# Patient Record
Sex: Female | Born: 1985 | Race: White | Hispanic: No | Marital: Married | Smoking: Former smoker
Health system: Southern US, Community
[De-identification: ages and names within clinical notes are randomized; demographics above are authoritative.]

## PROBLEM LIST (undated history)

## (undated) ENCOUNTER — Inpatient Hospital Stay (HOSPITAL_COMMUNITY): Payer: Self-pay

## (undated) DIAGNOSIS — I499 Cardiac arrhythmia, unspecified: Secondary | ICD-10-CM

## (undated) DIAGNOSIS — O24419 Gestational diabetes mellitus in pregnancy, unspecified control: Secondary | ICD-10-CM

## (undated) DIAGNOSIS — R51 Headache: Secondary | ICD-10-CM

## (undated) DIAGNOSIS — F419 Anxiety disorder, unspecified: Secondary | ICD-10-CM

## (undated) DIAGNOSIS — F329 Major depressive disorder, single episode, unspecified: Secondary | ICD-10-CM

## (undated) DIAGNOSIS — T8859XA Other complications of anesthesia, initial encounter: Secondary | ICD-10-CM

## (undated) DIAGNOSIS — F32A Depression, unspecified: Secondary | ICD-10-CM

## (undated) DIAGNOSIS — E282 Polycystic ovarian syndrome: Secondary | ICD-10-CM

## (undated) HISTORY — DX: Major depressive disorder, single episode, unspecified: F32.9

## (undated) HISTORY — DX: Headache: R51

## (undated) HISTORY — DX: Depression, unspecified: F32.A

## (undated) HISTORY — DX: Polycystic ovarian syndrome: E28.2

## (undated) HISTORY — DX: Anxiety disorder, unspecified: F41.9

## (undated) HISTORY — PX: TONSILLECTOMY: SUR1361

---

## 2012-09-13 ENCOUNTER — Ambulatory Visit (INDEPENDENT_AMBULATORY_CARE_PROVIDER_SITE_OTHER): Payer: 59 | Admitting: Psychiatry

## 2012-09-13 ENCOUNTER — Encounter (HOSPITAL_COMMUNITY): Payer: Self-pay | Admitting: Psychiatry

## 2012-09-13 VITALS — BP 112/81 | HR 88 | Ht 69.0 in | Wt 259.6 lb

## 2012-09-13 DIAGNOSIS — F329 Major depressive disorder, single episode, unspecified: Secondary | ICD-10-CM

## 2012-09-13 DIAGNOSIS — F339 Major depressive disorder, recurrent, unspecified: Secondary | ICD-10-CM

## 2012-09-13 DIAGNOSIS — E669 Obesity, unspecified: Secondary | ICD-10-CM

## 2012-09-13 MED ORDER — BUPROPION HCL ER (XL) 150 MG PO TB24: 150.0000 mg | ORAL_TABLET | ORAL | Status: DC
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
## 2012-09-28 ENCOUNTER — Ambulatory Visit (HOSPITAL_COMMUNITY): Payer: 59 | Admitting: Psychiatry

## 2012-10-04 ENCOUNTER — Ambulatory Visit (INDEPENDENT_AMBULATORY_CARE_PROVIDER_SITE_OTHER): Payer: 59 | Admitting: Psychiatry

## 2012-10-04 NOTE — Progress Notes (Signed)
Patient ID: Brenda Mcdaniel, female   DOB: 11-02-1985, 27 y.o.   MRN: [id number] Presenting Problem Chief Complaint: depression  What are the main stressors in your life right now, how long? 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  Previous mental health services Have you ever been treated for a mental health problem? Yes    Are you currently seeing a therapist or counselor, counselor's name? No   Have you ever had a mental health hospitalization, how many times, length of stay? No   Have you ever been treated with medication, name, reason, response? Yes. Prescribed lexapro and wellbutrin by Dr. Lolly Mustache, discontinued wellbutrin per recommendation of doctor at [facility]. Pt. Reports that she is doing well on the lexapro and xanax.   Have you ever had suicidal thoughts or attempted suicide, when, how? No   Risk factors for Suicide Demographic factors:  Adolescent or young adult and Caucasian Current mental status: none Loss factors: none Historical factors: Family history of mental illness or substance abuse Risk Reduction factors: Living with another person, especially a relative and Positive social support Clinical factors:  Depression and anxiety Cognitive features that contribute to risk: none  SUICIDE RISK:  Minimal: No identifiable suicidal ideation.  Patients presenting with no risk factors but with morbid ruminations; may be classified as minimal risk based on the severity of the depressive symptoms  Medical history   Current medications: lexapro and xanax as needed Prescribed by: [facility] Is there any history of mental health problems or substance abuse in your family, whom? Yes    Social/family history Have you been married, how many times?  Married April 2014  Do you have children?  no  How many pregnancies have you  had?  none  Who lives in your current household? Lives with husband  Military history: No   Religious/spiritual involvement:  What religion/faith base are you? Christian  Family of origin (childhood history)  Where were you born? [place] Where did you grow up? [place]  Describe the atmosphere of the household where you grew up: only child, raised by maternal grandmother. Often felt lonely and depressed. Do you have siblings, step/half siblings? Yes   Are your parents separated/divorced, when and why? Yes   Are your parents alive? Yes. Pt.'s mother remarried and moved away and Pt. Was raised by her maternal grandmother.  Social supports (personal and professional): husband, grandmother, half-brother and half-sister  Education How many grades have you completed? technical college Did you have any problems in school, what type? No  Medications prescribed for these problems? No   Employment (financial issues) Respiratory therapist with [employer]   Legal history none  Trauma/Abuse history: Have you ever been exposed to any form of abuse, what type? No   Have you ever been exposed to something traumatic, describe? No    Substance use None reported  Mental Status: General Appearance Brenda Mcdaniel:  Casual Eye Contact:  Good Motor Behavior:  Normal Speech:  Normal Level of Consciousness:  Alert Mood:  Euthymic Affect:  Appropriate Anxiety Level: minimal Thought Process:  Coherent Thought Content:  WNL Perception:  Normal Judgment:  Good Insight:  Present Cognition: wnl  Diagnosis AXIS I Depressive Disorder NOS  AXIS II No diagnosis  AXIS III Past Medical History  Diagnosis Date  . Headache(784.0)   . Depression     AXIS IV other psychosocial or environmental problems  AXIS V 51-60 moderate symptoms   Plan: Pt. To return in 2 weeks for continued assessment.  _________________________________________        Boneta Lucks, Ph.D., N[facility], [facility]

## 2012-10-19 ENCOUNTER — Other Ambulatory Visit (HOSPITAL_COMMUNITY): Payer: Self-pay | Admitting: *Deleted

## 2012-10-19 DIAGNOSIS — N6320 Unspecified lump in the left breast, unspecified quadrant: Secondary | ICD-10-CM

## 2012-10-25 ENCOUNTER — Ambulatory Visit (INDEPENDENT_AMBULATORY_CARE_PROVIDER_SITE_OTHER): Payer: 59 | Admitting: Psychiatry

## 2012-10-25 ENCOUNTER — Encounter (HOSPITAL_COMMUNITY): Payer: Self-pay | Admitting: Psychiatry

## 2012-10-25 ENCOUNTER — Ambulatory Visit (HOSPITAL_COMMUNITY)
Admission: RE | Admit: 2012-10-25 | Discharge: 2012-10-25 | Disposition: A | Discharge status: Home / Self Care | Payer: 59 | Source: Ambulatory Visit | Attending: Family Medicine | Admitting: Family Medicine

## 2012-10-25 DIAGNOSIS — N63 Unspecified lump in unspecified breast: Secondary | ICD-10-CM | POA: Insufficient documentation

## 2012-10-25 DIAGNOSIS — F4323 Adjustment disorder with mixed anxiety and depressed mood: Secondary | ICD-10-CM

## 2012-10-25 DIAGNOSIS — N6320 Unspecified lump in the left breast, unspecified quadrant: Secondary | ICD-10-CM

## 2012-10-25 NOTE — Progress Notes (Signed)
   THERAPIST PROGRESS NOTE  Session Time: 8:00-8:50  Participation Level: Active  Behavioral Response: CasualAlertEuthymic  Type of Therapy: Individual Therapy  Treatment Goals addressed: coping skills, emotion regulation  Interventions: CBT  Summary: Brenda Mcdaniel is a 27 y.o. female who presents with anxiety.   Suicidal/Homicidal: Nowithout intent/plan  Therapist Response: Processed relationship with mother, husband, and responsibility toward younger siblings. 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 Developed mantra "I am moving through this" to help cope with messages from co-workers and family. Followed up on use of 4.7.8 breathing and introduced heartmath as a stress management tool.  Plan: Return again in 2 weeks.  Diagnosis: Axis I: Anxiety Disorder NOS    Axis II: No diagnosis    Wynonia Musty 10/25/2012

## 2012-11-20 ENCOUNTER — Ambulatory Visit (HOSPITAL_COMMUNITY): Payer: Self-pay | Admitting: Psychiatry

## 2012-12-12 ENCOUNTER — Ambulatory Visit (HOSPITAL_COMMUNITY): Payer: Self-pay | Admitting: Psychiatry

## 2014-08-15 ENCOUNTER — Ambulatory Visit (HOSPITAL_COMMUNITY): Payer: Self-pay | Admitting: Psychiatry

## 2014-08-29 ENCOUNTER — Encounter (INDEPENDENT_AMBULATORY_CARE_PROVIDER_SITE_OTHER): Payer: Self-pay

## 2014-08-29 ENCOUNTER — Ambulatory Visit (INDEPENDENT_AMBULATORY_CARE_PROVIDER_SITE_OTHER): Payer: BLUE CROSS/BLUE SHIELD | Admitting: Psychiatry

## 2014-08-29 ENCOUNTER — Encounter (HOSPITAL_COMMUNITY): Payer: Self-pay | Admitting: Psychiatry

## 2014-08-29 DIAGNOSIS — F331 Major depressive disorder, recurrent, moderate: Secondary | ICD-10-CM

## 2014-08-29 NOTE — Patient Instructions (Signed)
Discussed orally 
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
## 2014-09-12 ENCOUNTER — Encounter (HOSPITAL_COMMUNITY): Payer: Self-pay | Admitting: Psychiatry

## 2014-09-12 ENCOUNTER — Ambulatory Visit (INDEPENDENT_AMBULATORY_CARE_PROVIDER_SITE_OTHER): Payer: BLUE CROSS/BLUE SHIELD | Admitting: Psychiatry

## 2014-09-12 VITALS — BP 115/66 | HR 73 | Ht 69.0 in | Wt 287.0 lb

## 2014-09-12 DIAGNOSIS — F329 Major depressive disorder, single episode, unspecified: Secondary | ICD-10-CM

## 2014-09-12 DIAGNOSIS — E282 Polycystic ovarian syndrome: Secondary | ICD-10-CM | POA: Diagnosis not present

## 2014-09-12 DIAGNOSIS — F32A Depression, unspecified: Secondary | ICD-10-CM

## 2014-09-12 MED ORDER — VILAZODONE HCL 40 MG PO TABS: 40.0000 mg | ORAL_TABLET | Freq: Every day | ORAL | Status: DC

## 2014-09-12 MED ORDER — BUSPIRONE HCL 30 MG PO TABS: 30.0000 mg | ORAL_TABLET | Freq: Two times a day (BID) | ORAL | Status: DC
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
## 2014-10-10 ENCOUNTER — Ambulatory Visit (HOSPITAL_COMMUNITY): Payer: Self-pay | Admitting: Psychiatry

## 2019-09-04 ENCOUNTER — Inpatient Hospital Stay (HOSPITAL_COMMUNITY)
Admission: AD | Admit: 2019-09-04 | Discharge: 2019-09-04 | Disposition: A | Discharge status: Home / Self Care | Payer: BC Managed Care – PPO | Attending: Obstetrics and Gynecology | Admitting: Obstetrics and Gynecology

## 2019-09-04 ENCOUNTER — Encounter (HOSPITAL_COMMUNITY): Payer: Self-pay | Admitting: Obstetrics and Gynecology

## 2019-09-04 ENCOUNTER — Other Ambulatory Visit: Payer: Self-pay

## 2019-09-04 DIAGNOSIS — N949 Unspecified condition associated with female genital organs and menstrual cycle: Secondary | ICD-10-CM | POA: Diagnosis not present

## 2019-09-04 DIAGNOSIS — Z3A13 13 weeks gestation of pregnancy: Secondary | ICD-10-CM | POA: Insufficient documentation

## 2019-09-04 DIAGNOSIS — O26892 Other specified pregnancy related conditions, second trimester: Secondary | ICD-10-CM

## 2019-09-04 DIAGNOSIS — F419 Anxiety disorder, unspecified: Secondary | ICD-10-CM | POA: Insufficient documentation

## 2019-09-04 DIAGNOSIS — Z87891 Personal history of nicotine dependence: Secondary | ICD-10-CM | POA: Diagnosis not present

## 2019-09-04 DIAGNOSIS — R102 Pelvic and perineal pain: Secondary | ICD-10-CM | POA: Diagnosis not present

## 2019-09-04 DIAGNOSIS — O30002 Twin pregnancy, unspecified number of placenta and unspecified number of amniotic sacs, second trimester: Secondary | ICD-10-CM | POA: Diagnosis not present

## 2019-09-04 DIAGNOSIS — Z3A14 14 weeks gestation of pregnancy: Secondary | ICD-10-CM

## 2019-09-04 DIAGNOSIS — Z7984 Long term (current) use of oral hypoglycemic drugs: Secondary | ICD-10-CM | POA: Insufficient documentation

## 2019-09-04 DIAGNOSIS — O26891 Other specified pregnancy related conditions, first trimester: Secondary | ICD-10-CM | POA: Diagnosis not present

## 2019-09-04 DIAGNOSIS — F329 Major depressive disorder, single episode, unspecified: Secondary | ICD-10-CM | POA: Insufficient documentation

## 2019-09-04 DIAGNOSIS — N898 Other specified noninflammatory disorders of vagina: Secondary | ICD-10-CM | POA: Diagnosis not present

## 2019-09-04 DIAGNOSIS — O99341 Other mental disorders complicating pregnancy, first trimester: Secondary | ICD-10-CM | POA: Diagnosis not present

## 2019-09-04 DIAGNOSIS — Z79899 Other long term (current) drug therapy: Secondary | ICD-10-CM | POA: Insufficient documentation

## 2019-09-04 LAB — WET PREP, GENITAL
Sperm: NONE SEEN
Trich, Wet Prep: NONE SEEN
Yeast Wet Prep HPF POC: NONE SEEN

## 2019-09-04 LAB — URINALYSIS, ROUTINE W REFLEX MICROSCOPIC
Bilirubin Urine: NEGATIVE
Glucose, UA: NEGATIVE mg/dL
Hgb urine dipstick: NEGATIVE
Ketones, ur: NEGATIVE mg/dL
Leukocytes,Ua: NEGATIVE
Nitrite: NEGATIVE
Protein, ur: NEGATIVE mg/dL
Specific Gravity, Urine: 1.004 — ABNORMAL LOW (ref 1.005–1.030)
pH: 6 (ref 5.0–8.0)

## 2019-09-04 LAB — AMNISURE RUPTURE OF MEMBRANE (ROM) NOT AT ARMC: Amnisure ROM: NEGATIVE

## 2019-09-04 NOTE — MAU Provider Note (Addendum)
History     CSN: [id number]  Arrival date and time: 09/04/19 1101   First Provider Initiated Contact with Patient 09/04/19 1146      Chief Complaint  Patient presents with  . Pelvic Pain  . Vaginal Discharge  . Rupture of Membranes   Brenda Mcdaniel is a 34 y.o. G1P0 at [redacted]w[redacted]d who presents today with cramping and leaking fluid. She states that she has had cramping pretty consistently throughout the pregnancy, but it is getting worse. She states that she has been leaking fluid in small gushes every couple hours for about 5 days. This is a twin pregnancy. She has had a LEEP done in 2017. Next OB visit 09/14/2019  Pelvic Pain The patient's primary symptoms include pelvic pain and vaginal discharge. This is a new problem. The current episode started in the past 7 days. The problem occurs intermittently. The problem has been gradually worsening. The problem affects both sides. She is pregnant. Associated symptoms include vomiting (x1 about 2 weeks ago). Pertinent negatives include no chills, fever or nausea. The vaginal discharge was watery and clear. There has been no bleeding. Nothing aggravates the symptoms. She has tried acetaminophen for the symptoms. The treatment provided no relief.  Vaginal Discharge The patient's primary symptoms include pelvic pain and vaginal discharge. Associated symptoms include vomiting (x1 about 2 weeks ago). Pertinent negatives include no chills, fever or nausea.    OB History    Gravida  1   Para      Term      Preterm      AB      Living        SAB      TAB      Ectopic      Multiple      Live Births              Past Medical History:  Diagnosis Date  . Anxiety   . Depression   . Headache(784.0)   . Polycystic ovarian disease     Past Surgical History:  Procedure Laterality Date  . TONSILLECTOMY      Family History  Problem Relation Age of Onset  . Depression Mother   . Alcohol abuse Father   . Schizophrenia Father   .  Depression Paternal Aunt   . Depression Paternal Uncle   . Depression Maternal Aunt   . Schizophrenia Maternal Grandfather     Social History   Tobacco Use  . Smoking status: Former Smoker    Types: Cigarettes  . Smokeless tobacco: Never Used  . Tobacco comment: 1 Pack a Week  Substance Use Topics  . Alcohol use: Yes    Alcohol/week: 0.0 standard drinks    Comment: a beer or liquour about 1 x per month  . Drug use: No    Allergies:  Allergies  Allergen Reactions  . Bactrim [Sulfamethoxazole-Trimethoprim] Itching    Yeast infection    Medications Prior to Admission  Medication Sig Dispense Refill Last Dose  . buPROPion (WELLBUTRIN XL) 300 MG 24 hr tablet Take 300 mg by mouth daily.   09/03/2019 at Unknown time  . escitalopram (LEXAPRO) 20 MG tablet Take 20 mg by mouth daily.   09/03/2019 at Unknown time  . famotidine (PEPCID) 20 MG tablet Take 20 mg by mouth 2 (two) times daily.   09/03/2019 at Unknown time  . folic acid (FOLVITE) 1 MG tablet Take 1 mg by mouth daily.   09/03/2019 at Unknown time  .  metFORMIN (GLUCOPHAGE) 500 MG tablet Take 500 mg by mouth 2 (two) times daily with a meal.   09/03/2019 at Unknown time  . Prenatal Vit-Fe Fumarate-FA (PRENATAL MULTIVITAMIN) TABS tablet Take 1 tablet by mouth daily at 12 noon.   09/03/2019 at Unknown time  . ALPRAZolam (XANAX) 0.25 MG tablet Take 0.25 mg by mouth at bedtime as needed for anxiety.     . busPIRone (BUSPAR) 30 MG tablet Take 1 tablet (30 mg total) by mouth 2 (two) times daily. 60 tablet 2   . ibuprofen (ADVIL,MOTRIN) 800 MG tablet Take 800 mg by mouth 3 (three) times daily as needed.     Marland Kitchen NAPROXEN PO Take by mouth 2 (two) times daily as needed.     . Vilazodone HCl (VIIBRYD) 40 MG TABS Take 1 tablet (40 mg total) by mouth daily. 30 tablet 2     Review of Systems  Constitutional: Negative for chills and fever.  Gastrointestinal: Positive for vomiting (x1 about 2 weeks ago). Negative for nausea.  Genitourinary: Positive  for pelvic pain and vaginal discharge.   Physical Exam   Blood pressure 127/76, pulse (!) 101, temperature 98.1 F (36.7 C), temperature source Oral, resp. rate 18, height 5\' 9"  (1.753 m), weight 121.6 kg, SpO2 100 %.  Physical Exam  Nursing note and vitals reviewed. Constitutional: She is oriented to person, place, and time. She appears well-developed and well-nourished. No distress.  HENT:  Head: Normocephalic.  Cardiovascular: Normal rate.  Respiratory: Effort normal.  GI: Soft. There is no abdominal tenderness. There is no rebound.  Genitourinary:    Genitourinary Comments:  External: no lesion Vagina: small amount of milky white discharge. No pooling  Cervix: pink, smooth, no CMT, closed/thick. No fluid seen with valsalva  Uterus: AGA    Neurological: She is alert and oriented to person, place, and time.  Skin: Skin is warm and dry.  Psychiatric: She has a normal mood and affect.     Pt informed that the ultrasound is considered a limited OB ultrasound and is not intended to be a complete ultrasound exam.  Patient also informed that the ultrasound is not being completed with the intent of assessing for fetal or placental anomalies or any pelvic abnormalities.  Explained that the purpose of today's ultrasound is to assess for  viability.  Patient acknowledges the purpose of the exam and the limitations of the study.   Baby A 150 Baby B 160 Subjectively normal amniotic fluid.    Results for orders placed or performed during the hospital encounter of 09/04/19 (from the past 24 hour(s))  Urinalysis, Routine w reflex microscopic     Status: Abnormal   Collection Time: 09/04/19 12:15 PM  Result Value Ref Range   Color, Urine STRAW (A) YELLOW   APPearance CLEAR CLEAR   Specific Gravity, Urine 1.004 (L) 1.005 - 1.030   pH 6.0 5.0 - 8.0   Glucose, UA NEGATIVE NEGATIVE mg/dL   Hgb urine dipstick NEGATIVE NEGATIVE   Bilirubin Urine NEGATIVE NEGATIVE   Ketones, ur NEGATIVE  NEGATIVE mg/dL   Protein, ur NEGATIVE NEGATIVE mg/dL   Nitrite NEGATIVE NEGATIVE   Leukocytes,Ua NEGATIVE NEGATIVE  Amnisure rupture of membrane (rom)not at [facility]     Status: None   Collection Time: 09/04/19 12:26 PM  Result Value Ref Range   Amnisure ROM NEGATIVE   Wet prep, genital     Status: Abnormal   Collection Time: 09/04/19 12:26 PM  Result Value Ref Range   Yeast Wet  Prep HPF POC NONE SEEN NONE SEEN   Trich, Wet Prep NONE SEEN NONE SEEN   Clue Cells Wet Prep HPF POC PRESENT (A) NONE SEEN   WBC, Wet Prep HPF POC MANY (A) NONE SEEN   Sperm NONE SEEN      MAU Course  Procedures  MDM  Assessment and Plan    1. Round ligament pain   2. Vaginal discharge during pregnancy in second trimester   3. [redacted] weeks gestation of pregnancy   4. Twin gestation in second trimester, unspecified multiple gestation type    DC home Comfort measures reviewed  2nd Trimester precautions  Bleeding precautions RX: none  Return to MAU as needed FU with OB as planned  Follow-up Information    Ob/Gyn, [facility] Follow up.   Contact information: [address] [place] [address] [place] DNP, CNM  09/04/19  1:45 PM

## 2019-09-04 NOTE — Discharge Instructions (Signed)

## 2019-09-04 NOTE — MAU Note (Signed)
Has been cramping pretty bad.  preg with twins. Will be 14wks this week. Cramps are more constant, though not more painful.  Has been having some leakage, clear.  Thought it was urine, had bought some liners, been going on about a wk. Had called office, couldn't get in- was told to come here.

## 2019-09-05 LAB — GC/CHLAMYDIA PROBE AMP (~~LOC~~) NOT AT ARMC
Chlamydia: NEGATIVE
Comment: NEGATIVE
Comment: NORMAL
Neisseria Gonorrhea: NEGATIVE

## 2019-10-03 ENCOUNTER — Encounter: Payer: BC Managed Care – PPO | Attending: Obstetrics and Gynecology | Admitting: Registered"

## 2019-10-03 ENCOUNTER — Other Ambulatory Visit: Payer: Self-pay

## 2019-10-03 ENCOUNTER — Encounter: Payer: Self-pay | Admitting: Registered"

## 2019-10-03 DIAGNOSIS — O99810 Abnormal glucose complicating pregnancy: Secondary | ICD-10-CM | POA: Diagnosis present

## 2019-10-03 NOTE — Progress Notes (Signed)
Patient was seen on 10/03/19 for Gestational Diabetes self-management class at the [facility]. The following learning objectives were met by the patient during this course:   States the definition of Gestational Diabetes  States why dietary management is important in controlling blood glucose  Describes the effects each nutrient has on blood glucose levels  Demonstrates ability to create a balanced meal plan  Demonstrates carbohydrate counting   States when to check blood glucose levels  Demonstrates proper blood glucose monitoring techniques  States the effect of stress and exercise on blood glucose levels  States the importance of limiting caffeine and abstaining from alcohol and smoking  Blood glucose monitor given: [employer] # [id number] Exp: 11/17/19 Blood glucose reading: 171 mg/dL  Patient instructed to monitor glucose levels: FBS: 60 - <95; 1 hour: <140; 2 hour: <120  Patient received handouts:  Nutrition Diabetes and Pregnancy, including carb counting list  Patient will be seen for follow-up as needed.

## 2020-01-11 ENCOUNTER — Telehealth: Payer: Self-pay

## 2020-01-11 NOTE — Telephone Encounter (Signed)
NOTES ON FILE FROM MICHELLE MARINONE [phone], SENT REFERRAL TO SCHEDULING

## 2020-01-16 ENCOUNTER — Other Ambulatory Visit: Payer: Self-pay | Admitting: Obstetrics and Gynecology

## 2020-02-05 ENCOUNTER — Telehealth (HOSPITAL_COMMUNITY): Payer: Self-pay | Admitting: *Deleted

## 2020-02-05 NOTE — Telephone Encounter (Signed)
Preadmission screen  

## 2020-02-05 NOTE — Patient Instructions (Signed)
Brenda Mcdaniel  02/05/2020   Your procedure is scheduled on:  02/19/2020  Arrive at 1030 at Entrance C on [address] at [facility]  and [address]. You are invited to use the FREE valet parking or use the Visitor's parking deck.  Pick up the phone at the desk and dial [phone].  Call this number if you have problems the morning of surgery: [phone]  Remember:   Do not eat food:(After Midnight) Desps de medianoche.  Do not drink clear liquids: (After Midnight) Desps de medianoche.  Take these medicines the morning of surgery with A SIP OF WATER:  Take lexapro and wellbutrin as prescribed.  Do not take metformin the night before or day of surgery.   Do not wear jewelry, make-up or nail polish.  Do not wear lotions, powders, or perfumes. Do not wear deodorant.  Do not shave 48 hours prior to surgery.  Do not bring valuables to the hospital.  [facility] is not   responsible for any belongings or valuables brought to the hospital.  Contacts, dentures or bridgework may not be worn into surgery.  Leave suitcase in the car. After surgery it may be brought to your room.  For patients admitted to the hospital, checkout time is 11:00 AM the day of              discharge.      Please read over the following fact sheets that you were given:     Preparing for Surgery

## 2020-02-07 ENCOUNTER — Telehealth (HOSPITAL_COMMUNITY): Payer: Self-pay | Admitting: *Deleted

## 2020-02-07 NOTE — Telephone Encounter (Signed)
Preadmission screen  

## 2020-02-10 NOTE — Progress Notes (Signed)
Cardiology Office Note:   Date:  02/11/2020  NAME:  Brenda Mcdaniel    MRN: [id number] DOB:  12/01/1985   PCP:  Selinda Flavin, MD  Cardiologist:  Reatha Harps, MD   Referring MD: Charlett Nose, MD   Chief Complaint  Patient presents with  . Tachycardia   History of Present Illness:   Brenda Mcdaniel is a 34 y.o. female with a hx of obesity who is being seen today for the evaluation of palpitations at the request of Charlett Nose, MD.  She reports since pregnant she has had episodes where her heart rate increases quite rapidly.  Associated symptoms include shortness of breath.  She reports certain activities such as laying on her back as well as certain movements can cause her heart rate to go up into the 170s.  She reports has not had any syncope but does get a little dizzy.  She reports her heart rate goes down with position change and runs around 90 to 110 bpm.  She does not get any chest pain.  This is her first pregnancy.  She does have gestational diabetes.  She does have a history of depression but there is no history of hypertension or cardiovascular disease.  She is morbidly obese with a BMI 42.  No concerns for sleep apnea.  She is not anemic per review of recent blood work.  Her hemoglobin was 11.5 in August 2021.  She is pregnant with twins.  She will have a delivery on February 19, 2020.  Her EKG today demonstrated sinus tachycardia initially with a heart rate of 165.  There is a clear P wave.  This was obtained on her back.  We did obtain several EKGs in the office.  When she laid on her right side her heart rate would come down gradually and still demonstrated sinus tachycardia.  There was no abrupt change in her heart rate to suggest an arrhythmia.  Her heart rate would come down to around 118 which is where she reports it is.  She was noticeably less short of breath when her heart rate was not 160.  I then asked her to lay back on her back.  Her heart rate increased  again.  Associated symptoms include shortness of breath.  This was all sinus tachycardia.  She is a former smoker but not currently smoking.  She is not drinking alcohol.  No illicit drugs.  There is no strong family history of heart disease.  No evidence of heart failure on examination.  Past Medical History: Past Medical History:  Diagnosis Date  . Anxiety   . Depression   . Headache(784.0)   . Polycystic ovarian disease     Past Surgical History: Past Surgical History:  Procedure Laterality Date  . TONSILLECTOMY      Current Medications: Current Meds  Medication Sig  . buPROPion (WELLBUTRIN XL) 300 MG 24 hr tablet Take 300 mg by mouth daily.  Marland Kitchen escitalopram (LEXAPRO) 20 MG tablet Take 20 mg by mouth daily.  Marland Kitchen esomeprazole (NEXIUM) 10 MG packet Nexium  . folic acid (FOLVITE) 1 MG tablet Take 1 mg by mouth daily.  . metFORMIN (GLUCOPHAGE) 500 MG tablet Take 500 mg by mouth 2 (two) times daily with a meal.  . Prenatal Vit-Fe Fumarate-FA (PRENATAL MULTIVITAMIN) TABS tablet Take 1 tablet by mouth daily at 12 noon.  . [DISCONTINUED] famotidine (PEPCID) 20 MG tablet Take 20 mg by mouth 2 (two) times daily.  Allergies:    Bactrim [sulfamethoxazole-trimethoprim]   Social History: Social History   Socioeconomic History  . Marital status: Married    Spouse name: Not on file  . Number of children: Not on file  . Years of education: Not on file  . Highest education level: Not on file  Occupational History  . Occupation: therapist  Tobacco Use  . Smoking status: Former Smoker    Years: 7.00    Types: Cigarettes  . Smokeless tobacco: Never Used  . Tobacco comment: 1 Pack a Week  Substance and Sexual Activity  . Alcohol use: Yes    Alcohol/week: 0.0 standard drinks    Comment: a beer or liquour about 1 x per month  . Drug use: No  . Sexual activity: Yes    Birth control/protection: None  Other Topics Concern  . Not on file  Social History Narrative  . Not on file    Social Determinants of Health   Financial Resource Strain:   . Difficulty of Paying Living Expenses: Not on file  Food Insecurity:   . Worried About Programme researcher, broadcasting/film/video in the Last Year: Not on file  . Ran Out of Food in the Last Year: Not on file  Transportation Needs:   . Lack of Transportation (Medical): Not on file  . Lack of Transportation (Non-Medical): Not on file  Physical Activity:   . Days of Exercise per Week: Not on file  . Minutes of Exercise per Session: Not on file  Stress:   . Feeling of Stress : Not on file  Social Connections:   . Frequency of Communication with Friends and Family: Not on file  . Frequency of Social Gatherings with Friends and Family: Not on file  . Attends Religious Services: Not on file  . Active Member of Clubs or Organizations: Not on file  . Attends Banker Meetings: Not on file  . Marital Status: Not on file     Family History: The patient's family history includes Alcohol abuse in her father; Depression in her maternal aunt, mother, paternal aunt, and paternal uncle; Schizophrenia in her father and maternal grandfather.  ROS:   All other ROS reviewed and negative. Pertinent positives noted in the HPI.     EKGs/Labs/Other Studies Reviewed:   The following studies were personally reviewed by me today:  EKG:  EKG is ordered today.  The ekg ordered today demonstrates sinus tachycardia, heart rate 165, subsequent EKG demonstrates sinus tachycardia, heart rate 132, subsequent EKG demonstrated sinus tachycardia with heart rate 119 all appeared to be sinus tachycardia and were personally reviewed by me.  Recent Labs: No results found for requested labs within last 8760 hours.   Recent Lipid Panel No results found for: CHOL, TRIG, HDL, CHOLHDL, VLDL, LDLCALC, LDLDIRECT  Physical Exam:   VS:  BP 115/70   Pulse (!) 165   Temp (!) 97.3 F (36.3 C)   Ht 5\' 7"  (1.702 m)   Wt 270 lb (122.5 kg)   SpO2 98%   BMI 42.29 kg/m     Wt Readings from Last 3 Encounters:  02/11/20 270 lb (122.5 kg)  09/04/19 268 lb (121.6 kg)    General: Well nourished, well developed, in no acute distress Heart: Atraumatic, normal size  Eyes: PEERLA, EOMI  Neck: Supple, no JVD Endocrine: No thryomegaly Cardiac: Normal S1, S2; RRR; no murmurs, rubs, or gallops Lungs: Clear to auscultation bilaterally, no wheezing, rhonchi or rales  Abd: Soft, nontender, no hepatomegaly  Ext: No edema, pulses 2+ Musculoskeletal: No deformities, BUE and BLE strength normal and equal Skin: Warm and dry, no rashes   Neuro: Alert and oriented to person, place, time, and situation, CNII-XII grossly intact, no focal deficits  Psych: Normal mood and affect   ASSESSMENT:   TAKEIA CIARAVINO is a 34 y.o. female who presents for the following: 1. Sinus tachycardia     PLAN:   1. Sinus tachycardia -Initial heart rate when laying on her back was 165.  EKG demonstrates sinus tachycardia.  When she lays on her side her heart rate goes down slowly.  We did observe her in the office with change in position.  Her heart rate would slowly come down.  This is suggestive of sinus tachycardia.  This is not an arrhythmia.  When I asked her to lay back on her back her heart rate would then gradually go back up into the 160 to 170 bpm range. -Overall I suspect she is having some sort of compressive issue from her pregnancy.  Clearly position change does cause her sinus node heart increase.  Again this is normal.  We do need to check a thyroid study to make sure this is not an issue.  She also was borderline anemic but is followed closely by obstetrics. -She has no murmur or evidence of heart failure on examination.  I do not see a need for an echocardiogram at this time. -I would recommend she avoid certain positions in which her heart rate increases and stay well-hydrated.  The ultimate treatment will be delivery.  I suspect most of her issues with her rapid heartbeat sensation  and sinus tachycardia will resolve with delivery.  I will see her back in 3 months after delivery to reassess symptoms.  I suspect all this will get better after delivery.  Disposition: Return in about 3 months (around 05/13/2020).  Medication Adjustments/Labs and Tests Ordered: Current medicines are reviewed at length with the patient today.  Concerns regarding medicines are outlined above.  Orders Placed This Encounter  Procedures  . TSH  . EKG 12-Lead   No orders of the defined types were placed in this encounter.   Patient Instructions  Medication Instructions:  No changes *If you need a refill on your cardiac medications before your next appointment, please call your pharmacy*   Lab Work: TSH If you have labs (blood work) drawn today and your tests are completely normal, you will receive your results only by: Marland Kitchen MyChart Message (if you have MyChart) OR . A paper copy in the mail If you have any lab test that is abnormal or we need to change your treatment, we will call you to review the results.   Testing/Procedures: Not needed   Follow-Up: At [facility], you and your health needs are our priority.  As part of our continuing mission to provide you with exceptional heart care, we have created designated Provider Care Teams.  These Care Teams include your primary Cardiologist (physician) and Advanced Practice Providers (APPs -  Physician Assistants and Nurse Practitioners) who all work together to provide you with the care you need, when you need it.  We recommend signing up for the patient portal called "MyChart".  Sign up information is provided on this After Visit Summary.  MyChart is used to connect with patients for Virtual Visits (Telemedicine).  Patients are able to view lab/test results, encounter notes, upcoming appointments, etc.  Non-urgent messages can be sent to your provider as well.  To learn more about what you can do with MyChart, go to  [email or web address].    Your next appointment:   3 month(s)  The format for your next appointment:   In Person  Provider:   Lennie OdorWesley O'Neal, MD     Signed, Lenna GilfordWesley T. Flora Lipps'Neal, MD [facility]  [facility]  [address], [address] [place], [place] [address] [phone]  02/11/2020 11:55 AM

## 2020-02-11 ENCOUNTER — Other Ambulatory Visit: Payer: Self-pay

## 2020-02-11 ENCOUNTER — Encounter: Payer: Self-pay | Admitting: Cardiovascular Disease

## 2020-02-11 ENCOUNTER — Ambulatory Visit (INDEPENDENT_AMBULATORY_CARE_PROVIDER_SITE_OTHER): Payer: BC Managed Care – PPO | Admitting: Cardiovascular Disease

## 2020-02-11 VITALS — BP 115/70 | HR 165 | Temp 97.3°F | Ht 67.0 in | Wt 270.0 lb

## 2020-02-11 DIAGNOSIS — R000 Tachycardia, unspecified: Secondary | ICD-10-CM

## 2020-02-11 NOTE — Patient Instructions (Signed)
Medication Instructions:  No changes *If you need a refill on your cardiac medications before your next appointment, please call your pharmacy*   Lab Work: TSH If you have labs (blood work) drawn today and your tests are completely normal, you will receive your results only by: Marland Kitchen MyChart Message (if you have MyChart) OR . A paper copy in the mail If you have any lab test that is abnormal or we need to change your treatment, we will call you to review the results.   Testing/Procedures: Not needed   Follow-Up: At [facility], you and your health needs are our priority.  As part of our continuing mission to provide you with exceptional heart care, we have created designated Provider Care Teams.  These Care Teams include your primary Cardiologist (physician) and Advanced Practice Providers (APPs -  Physician Assistants and Nurse Practitioners) who all work together to provide you with the care you need, when you need it.  We recommend signing up for the patient portal called "MyChart".  Sign up information is provided on this After Visit Summary.  MyChart is used to connect with patients for Virtual Visits (Telemedicine).  Patients are able to view lab/test results, encounter notes, upcoming appointments, etc.  Non-urgent messages can be sent to your provider as well.   To learn more about what you can do with MyChart, go to [email or web address].    Your next appointment:   3 month(s)  The format for your next appointment:   In Person  Provider:   Lennie Odor, MD

## 2020-02-12 ENCOUNTER — Telehealth (HOSPITAL_COMMUNITY): Payer: Self-pay | Admitting: *Deleted

## 2020-02-12 ENCOUNTER — Encounter (HOSPITAL_COMMUNITY): Payer: Self-pay

## 2020-02-12 LAB — TSH: TSH: 2.36 u[IU]/mL (ref 0.450–4.500)

## 2020-02-12 NOTE — Telephone Encounter (Signed)
Preadmission screen  

## 2020-02-14 ENCOUNTER — Encounter: Payer: Self-pay | Admitting: *Deleted

## 2020-02-18 ENCOUNTER — Other Ambulatory Visit (HOSPITAL_COMMUNITY)
Admission: RE | Admit: 2020-02-18 | Discharge: 2020-02-18 | Disposition: A | Discharge status: Home / Self Care | Payer: BC Managed Care – PPO | Source: Ambulatory Visit | Attending: Obstetrics and Gynecology | Admitting: Obstetrics and Gynecology

## 2020-02-18 ENCOUNTER — Telehealth (HOSPITAL_COMMUNITY): Payer: Self-pay | Admitting: *Deleted

## 2020-02-18 ENCOUNTER — Other Ambulatory Visit: Payer: Self-pay

## 2020-02-18 ENCOUNTER — Other Ambulatory Visit (HOSPITAL_COMMUNITY)
Admission: RE | Admit: 2020-02-18 | Discharge: 2020-02-18 | Disposition: A | Discharge status: Home / Self Care | Payer: BC Managed Care – PPO | Source: Ambulatory Visit | Attending: Obstetrics & Gynecology | Admitting: Obstetrics & Gynecology

## 2020-02-18 ENCOUNTER — Encounter (HOSPITAL_COMMUNITY): Payer: Self-pay

## 2020-02-18 DIAGNOSIS — Z20822 Contact with and (suspected) exposure to covid-19: Secondary | ICD-10-CM | POA: Insufficient documentation

## 2020-02-18 DIAGNOSIS — Z01818 Encounter for other preprocedural examination: Secondary | ICD-10-CM | POA: Insufficient documentation

## 2020-02-18 HISTORY — DX: Other complications of anesthesia, initial encounter: T88.59XA

## 2020-02-18 HISTORY — DX: Cardiac arrhythmia, unspecified: I49.9

## 2020-02-18 LAB — TYPE AND SCREEN
ABO/RH(D): A POS
Antibody Screen: NEGATIVE

## 2020-02-18 LAB — RPR: RPR Ser Ql: NONREACTIVE

## 2020-02-18 LAB — CBC
HCT: 33.3 % — ABNORMAL LOW (ref 36.0–46.0)
Hemoglobin: 10 g/dL — ABNORMAL LOW (ref 12.0–15.0)
MCH: 21.3 pg — ABNORMAL LOW (ref 26.0–34.0)
MCHC: 30 g/dL (ref 30.0–36.0)
MCV: 71 fL — ABNORMAL LOW (ref 80.0–100.0)
Platelets: 307 10*3/uL (ref 150–400)
RBC: 4.69 MIL/uL (ref 3.87–5.11)
RDW: 17.3 % — ABNORMAL HIGH (ref 11.5–15.5)
WBC: 7.5 10*3/uL (ref 4.0–10.5)
nRBC: 0 % (ref 0.0–0.2)

## 2020-02-18 LAB — SARS CORONAVIRUS 2 (TAT 6-24 HRS): SARS Coronavirus 2: NEGATIVE

## 2020-02-18 NOTE — Telephone Encounter (Signed)
Cs arrival time changed to 0530.  CS rescheduled to 0730 at Dr Henderson Cloud request.  Pt notified and verbalized understanding of change in arrival time.

## 2020-02-19 ENCOUNTER — Encounter (HOSPITAL_COMMUNITY)
Admission: RE | Disposition: A | Discharge status: Home / Self Care | Payer: Self-pay | Source: Home / Self Care | Attending: Obstetrics and Gynecology

## 2020-02-19 ENCOUNTER — Inpatient Hospital Stay (HOSPITAL_COMMUNITY)
Admission: RE | Admit: 2020-02-19 | Discharge: 2020-02-21 | DRG: 786 | Disposition: A | Discharge status: Home / Self Care | Payer: BC Managed Care – PPO | Attending: Obstetrics and Gynecology | Admitting: Obstetrics and Gynecology

## 2020-02-19 ENCOUNTER — Encounter (HOSPITAL_COMMUNITY): Payer: Self-pay | Admitting: Obstetrics and Gynecology

## 2020-02-19 ENCOUNTER — Inpatient Hospital Stay (HOSPITAL_COMMUNITY): Payer: BC Managed Care – PPO | Admitting: Certified Registered Nurse Anesthetist

## 2020-02-19 ENCOUNTER — Other Ambulatory Visit: Payer: Self-pay

## 2020-02-19 DIAGNOSIS — O2412 Pre-existing diabetes mellitus, type 2, in childbirth: Secondary | ICD-10-CM | POA: Diagnosis present

## 2020-02-19 DIAGNOSIS — O328XX2 Maternal care for other malpresentation of fetus, fetus 2: Secondary | ICD-10-CM | POA: Diagnosis present

## 2020-02-19 DIAGNOSIS — O30043 Twin pregnancy, dichorionic/diamniotic, third trimester: Secondary | ICD-10-CM | POA: Diagnosis present

## 2020-02-19 DIAGNOSIS — O9081 Anemia of the puerperium: Secondary | ICD-10-CM | POA: Diagnosis not present

## 2020-02-19 DIAGNOSIS — E119 Type 2 diabetes mellitus without complications: Secondary | ICD-10-CM | POA: Diagnosis present

## 2020-02-19 DIAGNOSIS — Z7984 Long term (current) use of oral hypoglycemic drugs: Secondary | ICD-10-CM

## 2020-02-19 DIAGNOSIS — Z20822 Contact with and (suspected) exposure to covid-19: Secondary | ICD-10-CM | POA: Diagnosis present

## 2020-02-19 DIAGNOSIS — Z3A37 37 weeks gestation of pregnancy: Secondary | ICD-10-CM

## 2020-02-19 DIAGNOSIS — Z87891 Personal history of nicotine dependence: Secondary | ICD-10-CM

## 2020-02-19 DIAGNOSIS — D62 Acute posthemorrhagic anemia: Secondary | ICD-10-CM | POA: Diagnosis not present

## 2020-02-19 DIAGNOSIS — O329XX1 Maternal care for malpresentation of fetus, unspecified, fetus 1: Secondary | ICD-10-CM | POA: Diagnosis present

## 2020-02-19 LAB — GLUCOSE, CAPILLARY
Glucose-Capillary: 69 mg/dL — ABNORMAL LOW (ref 70–99)
Glucose-Capillary: 86 mg/dL (ref 70–99)
Glucose-Capillary: 80 mg/dL (ref 70–99)
Glucose-Capillary: 117 mg/dL — ABNORMAL HIGH (ref 70–99)
Glucose-Capillary: 68 mg/dL — ABNORMAL LOW (ref 70–99)
Glucose-Capillary: 77 mg/dL (ref 70–99)
Glucose-Capillary: 50 mg/dL — ABNORMAL LOW (ref 70–99)
Glucose-Capillary: 76 mg/dL (ref 70–99)

## 2020-02-19 SURGERY — Surgical Case
Anesthesia: Spinal | Wound class: Clean Contaminated

## 2020-02-19 MED ORDER — DEXTROSE 50 % IV SOLN
12.5000 g | INTRAVENOUS | Status: AC
  Administered 2020-02-19: 12.5 g via INTRAVENOUS

## 2020-02-19 MED ORDER — PHENYLEPHRINE 40 MCG/ML (10ML) SYRINGE FOR IV PUSH (FOR BLOOD PRESSURE SUPPORT)
PREFILLED_SYRINGE | INTRAVENOUS | Status: AC
  Filled 2020-02-19: qty 20

## 2020-02-19 MED ORDER — HYDROMORPHONE HCL 1 MG/ML IJ SOLN: 0.2500 mg | INTRAMUSCULAR | Status: DC | PRN

## 2020-02-19 MED ORDER — DEXTROSE 5 % IV SOLN
3.0000 g | INTRAVENOUS | Status: AC
  Administered 2020-02-19: 3 g via INTRAVENOUS

## 2020-02-19 MED ORDER — MEASLES, MUMPS & RUBELLA VAC IJ SOLR: 0.5000 mL | Freq: Once | INTRAMUSCULAR | Status: DC

## 2020-02-19 MED ORDER — MEPERIDINE HCL 25 MG/ML IJ SOLN: 6.2500 mg | INTRAMUSCULAR | Status: DC | PRN

## 2020-02-19 MED ORDER — SCOPOLAMINE 1 MG/3DAYS TD PT72
1.0000 | MEDICATED_PATCH | Freq: Once | TRANSDERMAL | Status: DC
  Administered 2020-02-19: 1.5 mg via TRANSDERMAL

## 2020-02-19 MED ORDER — ZOLPIDEM TARTRATE 5 MG PO TABS: 5.0000 mg | ORAL_TABLET | Freq: Every evening | ORAL | Status: DC | PRN

## 2020-02-19 MED ORDER — DIPHENHYDRAMINE HCL 25 MG PO CAPS: 25.0000 mg | ORAL_CAPSULE | Freq: Four times a day (QID) | ORAL | Status: DC | PRN

## 2020-02-19 MED ORDER — DEXAMETHASONE SODIUM PHOSPHATE 4 MG/ML IJ SOLN
INTRAMUSCULAR | Status: AC
  Filled 2020-02-19: qty 1

## 2020-02-19 MED ORDER — NALBUPHINE HCL 10 MG/ML IJ SOLN: 5.0000 mg | Freq: Once | INTRAMUSCULAR | Status: DC | PRN

## 2020-02-19 MED ORDER — PRENATAL MULTIVITAMIN CH
1.0000 | ORAL_TABLET | Freq: Every day | ORAL | Status: DC
  Administered 2020-02-20: 1 via ORAL
  Filled 2020-02-19: qty 1

## 2020-02-19 MED ORDER — LACTATED RINGERS IV SOLN: INTRAVENOUS | Status: DC

## 2020-02-19 MED ORDER — FERROUS SULFATE 325 (65 FE) MG PO TABS
325.0000 mg | ORAL_TABLET | Freq: Two times a day (BID) | ORAL | Status: DC
  Administered 2020-02-19 – 2020-02-21 (×4): 325 mg via ORAL
  Filled 2020-02-19 (×4): qty 1

## 2020-02-19 MED ORDER — COCONUT OIL OIL: 1.0000 "application " | TOPICAL_OIL | Status: DC | PRN

## 2020-02-19 MED ORDER — NALBUPHINE HCL 10 MG/ML IJ SOLN: 5.0000 mg | INTRAMUSCULAR | Status: DC | PRN

## 2020-02-19 MED ORDER — ONDANSETRON HCL 4 MG/2ML IJ SOLN: 4.0000 mg | Freq: Once | INTRAMUSCULAR | Status: DC | PRN

## 2020-02-19 MED ORDER — DEXAMETHASONE SODIUM PHOSPHATE 4 MG/ML IJ SOLN
INTRAMUSCULAR | Status: DC | PRN
  Administered 2020-02-19: 4 mg via INTRAVENOUS

## 2020-02-19 MED ORDER — NALOXONE HCL 0.4 MG/ML IJ SOLN: 0.4000 mg | INTRAMUSCULAR | Status: DC | PRN

## 2020-02-19 MED ORDER — METHYLERGONOVINE MALEATE 0.2 MG PO TABS: 0.2000 mg | ORAL_TABLET | ORAL | Status: DC | PRN

## 2020-02-19 MED ORDER — TETANUS-DIPHTH-ACELL PERTUSSIS 5-2.5-18.5 LF-MCG/0.5 IM SUSY: 0.5000 mL | PREFILLED_SYRINGE | Freq: Once | INTRAMUSCULAR | Status: DC

## 2020-02-19 MED ORDER — FENTANYL CITRATE (PF) 100 MCG/2ML IJ SOLN
INTRAMUSCULAR | Status: DC | PRN
  Administered 2020-02-19: 15 ug via INTRATHECAL

## 2020-02-19 MED ORDER — OXYTOCIN-SODIUM CHLORIDE 30-0.9 UT/500ML-% IV SOLN
INTRAVENOUS | Status: DC | PRN
  Administered 2020-02-19 (×2): 150 mL via INTRAVENOUS

## 2020-02-19 MED ORDER — METHYLERGONOVINE MALEATE 0.2 MG/ML IJ SOLN: 0.2000 mg | INTRAMUSCULAR | Status: DC | PRN

## 2020-02-19 MED ORDER — DIPHENHYDRAMINE HCL 25 MG PO CAPS: 25.0000 mg | ORAL_CAPSULE | ORAL | Status: DC | PRN

## 2020-02-19 MED ORDER — MENTHOL 3 MG MT LOZG: 1.0000 | LOZENGE | OROMUCOSAL | Status: DC | PRN

## 2020-02-19 MED ORDER — SOD CITRATE-CITRIC ACID 500-334 MG/5ML PO SOLN
ORAL | Status: AC
  Filled 2020-02-19: qty 30

## 2020-02-19 MED ORDER — MORPHINE SULFATE (PF) 0.5 MG/ML IJ SOLN
INTRAMUSCULAR | Status: AC
  Filled 2020-02-19: qty 10

## 2020-02-19 MED ORDER — OXYTOCIN-SODIUM CHLORIDE 30-0.9 UT/500ML-% IV SOLN
INTRAVENOUS | Status: AC
  Filled 2020-02-19: qty 500

## 2020-02-19 MED ORDER — PHENYLEPHRINE HCL-NACL 20-0.9 MG/250ML-% IV SOLN
INTRAVENOUS | Status: DC | PRN
  Administered 2020-02-19: 60 ug/min via INTRAVENOUS

## 2020-02-19 MED ORDER — ONDANSETRON HCL 4 MG/2ML IJ SOLN
INTRAMUSCULAR | Status: DC | PRN
  Administered 2020-02-19: 4 mg via INTRAVENOUS

## 2020-02-19 MED ORDER — DEXTROSE 50 % IV SOLN
INTRAVENOUS | Status: AC
  Filled 2020-02-19: qty 50

## 2020-02-19 MED ORDER — DIBUCAINE (PERIANAL) 1 % EX OINT: 1.0000 "application " | TOPICAL_OINTMENT | CUTANEOUS | Status: DC | PRN

## 2020-02-19 MED ORDER — FENTANYL CITRATE (PF) 100 MCG/2ML IJ SOLN
INTRAMUSCULAR | Status: AC
  Filled 2020-02-19: qty 2

## 2020-02-19 MED ORDER — SODIUM CHLORIDE 0.9% FLUSH: 3.0000 mL | INTRAVENOUS | Status: DC | PRN

## 2020-02-19 MED ORDER — ACETAMINOPHEN 500 MG PO TABS
1000.0000 mg | ORAL_TABLET | Freq: Four times a day (QID) | ORAL | Status: AC
  Administered 2020-02-19 (×2): 1000 mg via ORAL
  Filled 2020-02-19 (×2): qty 2

## 2020-02-19 MED ORDER — NALOXONE HCL 4 MG/10ML IJ SOLN
1.0000 ug/kg/h | INTRAVENOUS | Status: DC | PRN
  Filled 2020-02-19: qty 5

## 2020-02-19 MED ORDER — SCOPOLAMINE 1 MG/3DAYS TD PT72
MEDICATED_PATCH | TRANSDERMAL | Status: AC
  Filled 2020-02-19: qty 1

## 2020-02-19 MED ORDER — SIMETHICONE 80 MG PO CHEW
80.0000 mg | CHEWABLE_TABLET | Freq: Three times a day (TID) | ORAL | Status: DC
  Administered 2020-02-19 – 2020-02-21 (×6): 80 mg via ORAL
  Filled 2020-02-19 (×6): qty 1

## 2020-02-19 MED ORDER — ONDANSETRON HCL 4 MG/2ML IJ SOLN
INTRAMUSCULAR | Status: AC
  Filled 2020-02-19: qty 2

## 2020-02-19 MED ORDER — SENNOSIDES-DOCUSATE SODIUM 8.6-50 MG PO TABS
2.0000 | ORAL_TABLET | ORAL | Status: DC
  Administered 2020-02-20 (×2): 2 via ORAL
  Filled 2020-02-19 (×2): qty 2

## 2020-02-19 MED ORDER — OXYTOCIN-SODIUM CHLORIDE 30-0.9 UT/500ML-% IV SOLN: 2.5000 [IU]/h | INTRAVENOUS | Status: AC

## 2020-02-19 MED ORDER — PHENYLEPHRINE 40 MCG/ML (10ML) SYRINGE FOR IV PUSH (FOR BLOOD PRESSURE SUPPORT)
PREFILLED_SYRINGE | INTRAVENOUS | Status: DC | PRN
  Administered 2020-02-19 (×5): 120 ug via INTRAVENOUS

## 2020-02-19 MED ORDER — BUPROPION HCL ER (XL) 150 MG PO TB24
150.0000 mg | ORAL_TABLET | Freq: Every day | ORAL | Status: DC
  Administered 2020-02-20 – 2020-02-21 (×2): 150 mg via ORAL
  Filled 2020-02-19 (×2): qty 1

## 2020-02-19 MED ORDER — FLEET ENEMA 7-19 GM/118ML RE ENEM: 1.0000 | ENEMA | Freq: Every day | RECTAL | Status: DC | PRN

## 2020-02-19 MED ORDER — SIMETHICONE 80 MG PO CHEW
80.0000 mg | CHEWABLE_TABLET | ORAL | Status: DC
  Administered 2020-02-20 (×2): 80 mg via ORAL
  Filled 2020-02-19 (×2): qty 1

## 2020-02-19 MED ORDER — BISACODYL 10 MG RE SUPP: 10.0000 mg | Freq: Every day | RECTAL | Status: DC | PRN

## 2020-02-19 MED ORDER — IBUPROFEN 800 MG PO TABS
800.0000 mg | ORAL_TABLET | Freq: Three times a day (TID) | ORAL | Status: DC
  Administered 2020-02-19 – 2020-02-21 (×6): 800 mg via ORAL
  Filled 2020-02-19 (×6): qty 1

## 2020-02-19 MED ORDER — ACETAMINOPHEN 500 MG PO TABS
1000.0000 mg | ORAL_TABLET | ORAL | Status: AC
  Administered 2020-02-19: 1000 mg via ORAL

## 2020-02-19 MED ORDER — PHENYLEPHRINE HCL-NACL 20-0.9 MG/250ML-% IV SOLN
INTRAVENOUS | Status: AC
  Filled 2020-02-19: qty 250

## 2020-02-19 MED ORDER — SIMETHICONE 80 MG PO CHEW: 80.0000 mg | CHEWABLE_TABLET | ORAL | Status: DC | PRN

## 2020-02-19 MED ORDER — POVIDONE-IODINE 10 % EX SWAB: 2.0000 "application " | Freq: Once | CUTANEOUS | Status: DC

## 2020-02-19 MED ORDER — DEXTROSE 5 % IV SOLN
INTRAVENOUS | Status: AC
  Filled 2020-02-19: qty 3000

## 2020-02-19 MED ORDER — BUPIVACAINE IN DEXTROSE 0.75-8.25 % IT SOLN
INTRATHECAL | Status: DC | PRN
  Administered 2020-02-19: 1.8 mL via INTRATHECAL

## 2020-02-19 MED ORDER — ESCITALOPRAM OXALATE 20 MG PO TABS
20.0000 mg | ORAL_TABLET | Freq: Every day | ORAL | Status: DC
  Administered 2020-02-20 – 2020-02-21 (×2): 20 mg via ORAL
  Filled 2020-02-19 (×2): qty 1

## 2020-02-19 MED ORDER — DIPHENHYDRAMINE HCL 50 MG/ML IJ SOLN: 12.5000 mg | INTRAMUSCULAR | Status: DC | PRN

## 2020-02-19 MED ORDER — OXYCODONE-ACETAMINOPHEN 5-325 MG PO TABS
1.0000 | ORAL_TABLET | ORAL | Status: DC | PRN
  Administered 2020-02-20: 1 via ORAL
  Administered 2020-02-20 (×4): 2 via ORAL
  Administered 2020-02-20: 1 via ORAL
  Administered 2020-02-21 (×3): 2 via ORAL
  Filled 2020-02-19: qty 1
  Filled 2020-02-19 (×7): qty 2
  Filled 2020-02-19: qty 1

## 2020-02-19 MED ORDER — SOD CITRATE-CITRIC ACID 500-334 MG/5ML PO SOLN
30.0000 mL | ORAL | Status: AC
  Administered 2020-02-19: 30 mL via ORAL

## 2020-02-19 MED ORDER — ACETAMINOPHEN 500 MG PO TABS
ORAL_TABLET | ORAL | Status: AC
  Filled 2020-02-19: qty 2

## 2020-02-19 MED ORDER — PANTOPRAZOLE SODIUM 40 MG PO TBEC
40.0000 mg | DELAYED_RELEASE_TABLET | Freq: Every day | ORAL | Status: DC
  Administered 2020-02-20 – 2020-02-21 (×2): 40 mg via ORAL
  Filled 2020-02-19 (×2): qty 1

## 2020-02-19 MED ORDER — ONDANSETRON HCL 4 MG/2ML IJ SOLN: 4.0000 mg | Freq: Three times a day (TID) | INTRAMUSCULAR | Status: DC | PRN

## 2020-02-19 MED ORDER — WITCH HAZEL-GLYCERIN EX PADS: 1.0000 "application " | MEDICATED_PAD | CUTANEOUS | Status: DC | PRN

## 2020-02-19 MED ORDER — MORPHINE SULFATE (PF) 0.5 MG/ML IJ SOLN
INTRAMUSCULAR | Status: DC | PRN
  Administered 2020-02-19: 150 ug via INTRATHECAL

## 2020-02-19 SURGICAL SUPPLY — 33 items
APL SKNCLS STERI-STRIP NONHPOA (GAUZE/BANDAGES/DRESSINGS) ×1
BENZOIN TINCTURE PRP APPL 2/3 (GAUZE/BANDAGES/DRESSINGS) ×2 IMPLANT
CHLORAPREP W/TINT 26ML (MISCELLANEOUS) ×2 IMPLANT
CLAMP CORD UMBIL (MISCELLANEOUS) IMPLANT
CLOTH BEACON ORANGE TIMEOUT ST (SAFETY) ×2 IMPLANT
DRSG OPSITE POSTOP 4X10 (GAUZE/BANDAGES/DRESSINGS) ×2 IMPLANT
ELECT REM PT RETURN 9FT ADLT (ELECTROSURGICAL) ×2
ELECTRODE REM PT RTRN 9FT ADLT (ELECTROSURGICAL) ×1 IMPLANT
EXTRACTOR VACUUM BELL STYLE (SUCTIONS) IMPLANT
GLOVE BIO SURGEON STRL SZ7 (GLOVE) ×2 IMPLANT
GLOVE BIOGEL PI IND STRL 7.0 (GLOVE) ×1 IMPLANT
GLOVE BIOGEL PI INDICATOR 7.0 (GLOVE) ×1
GOWN STRL REUS W/TWL LRG LVL3 (GOWN DISPOSABLE) ×4 IMPLANT
KIT ABG SYR 3ML LUER SLIP (SYRINGE) IMPLANT
NDL HYPO 25X5/8 SAFETYGLIDE (NEEDLE) IMPLANT
NEEDLE HYPO 25X5/8 SAFETYGLIDE (NEEDLE) IMPLANT
NS IRRIG 1000ML POUR BTL (IV SOLUTION) ×2 IMPLANT
PACK C SECTION WH (CUSTOM PROCEDURE TRAY) ×2 IMPLANT
PAD OB MATERNITY 4.3X12.25 (PERSONAL CARE ITEMS) ×2 IMPLANT
PENCIL SMOKE EVAC W/HOLSTER (ELECTROSURGICAL) ×2 IMPLANT
RTRCTR C-SECT PINK 25CM LRG (MISCELLANEOUS) ×2 IMPLANT
STRIP CLOSURE SKIN 1/2X4 (GAUZE/BANDAGES/DRESSINGS) ×2 IMPLANT
SUT MNCRL 0 VIOLET CTX 36 (SUTURE) ×2 IMPLANT
SUT MONOCRYL 0 CTX 36 (SUTURE) ×4
SUT PLAIN 2 0 XLH (SUTURE) IMPLANT
SUT VIC AB 0 CT1 27 (SUTURE) ×4
SUT VIC AB 0 CT1 27XBRD ANBCTR (SUTURE) ×2 IMPLANT
SUT VIC AB 2-0 CT1 27 (SUTURE) ×2
SUT VIC AB 2-0 CT1 TAPERPNT 27 (SUTURE) ×1 IMPLANT
SUT VIC AB 4-0 KS 27 (SUTURE) ×2 IMPLANT
TOWEL OR 17X24 6PK STRL BLUE (TOWEL DISPOSABLE) ×2 IMPLANT
TRAY FOLEY W/BAG SLVR 14FR LF (SET/KITS/TRAYS/PACK) ×2 IMPLANT
WATER STERILE IRR 1000ML POUR (IV SOLUTION) ×2 IMPLANT

## 2020-02-19 NOTE — Op Note (Signed)
02/19/2020  8:31 AM  PATIENT:  Brenda Mcdaniel  34 y.o. female  PRE-OPERATIVE DIAGNOSIS:  DiDi Twins Malpresentation Gestational Diabetes  POST-OPERATIVE DIAGNOSIS:  DiDi Twins Malpresentation  PROCEDURE:  Procedure(s): CESAREAN SECTION MULTI-GESTATIONAL (N/A)  SURGEON:  Surgeon(s) and Role:    * Carrington Clamp, MD - Primary    * Taam-Akelman, Griselda Miner, MD - Assisting  ANESTHESIA:   spinal  EBL: <500cc  SPECIMEN:  Source of Specimen:  placenta  DISPOSITION OF SPECIMEN:  PATHOLOGY  COUNTS:  YES  TOURNIQUET:  * No tourniquets in log *  DICTATION: .Note written in EPIC  PLAN OF CARE: Admit to inpatient   PATIENT DISPOSITION:  PACU - hemodynamically stable.   Delay start of Pharmacological VTE agent (>24hrs) due to surgical blood loss or risk of bleeding: not applicable  Complications:  none Medications:  Ancef, Pitocin Findings:  Baby A boy, Apgars pending, weight P.   Baby B girl, Apgars pending, weight P.   Normal tubes, ovaries and uterus seen.  Baby B was skin to skin with mother after birth in the OR.  Both babies in OR.    Technique:  After adequate spinal anesthesia was achieved, the patient was prepped and draped in usual sterile fashion.  A foley catheter was used to drain the bladder.  A pfannanstiel incision was made with the scalpel and carried down to the fascia with the bovie cautery. The fascia was incised in the midline with the scalpel and carried in a transverse curvilinear manner bilaterally.  The fascia was reflected superiorly and inferiorly off the rectus muscles and the muscles split in the midline.  A bowel free portion of the peritoneum was entered bluntly and then extended in a superior and inferior manner with good visualization of the bowel and bladder.  The Alexis instrument was then placed and the vesico-uterine fascia tented up and incised in a transverse curvilinear manner.  A 2 cm transverse incision was made in the upper portion of the  lower uterine segment until the amnion was exposed.   The incision was extended transversely in a blunt manner.  Clear fluid was noted and Baby A delivered in the vertex presentation without complication.  The baby was bulb suctioned and the cord was clamped and cut after one minute of allowing cord blood to flow into baby.  Baby A was then handed to awaiting Neonatology.  Baby B was identified as footling breech and delivered in usual manner after reducing a nuchal arm.  Baby B was bulb suction, cord clamped and cut after allowing about 30 seconds of cord blood to flow into baby.  Baby B was handed to awaiting Neo.   The placenta was then delivered manually and the uterus cleared of all debris.  The uterine incision was then closed with a running lock stitch of 0 monocryl.  An imbricating layer of 0 monocryl was closed as well. Excellent hemostasis of the uterine incision was achieved and the abdomen was cleared with irrigation.  The peritoneum was closed with a running stitch of 2-0 vicryl.  This incorporated the rectus muscles as a separate layer.  The fascia was then closed with a running stitch of 0 vicryl.  The subcutaneous layer was closed with interrupted  stitches of 2-0 plain gut.  The skin was closed with 4-0 vicryl on a Keith needle and steri-strips.  The patient tolerated the procedure well and was returned to the recovery room in stable condition.  All counts were correct times  three.  Loney Laurence

## 2020-02-19 NOTE — Progress Notes (Signed)
[name] was referred for history of depression/anxiety. °* Referral screened out by Clinical Social Worker because none of the following criteria appear to apply: °~ History of anxiety/depression during this pregnancy, or of post-partum depression following prior delivery. °~ Diagnosis of anxiety and/or depression within last 3 years °OR °* [name]'s symptoms currently being treated with medication and/or therapy. Per further chart review, it is noted that [name] is on Lexapro and Wellbutrin for anxiety and depression.  ° ° ° °Please contact the Clinical Social Worker if needs arise, by [name] request, or if [name] scores greater than 9/yes to question 10 on Edinburgh Postpartum Depression Screen. ° ° °[name] Marchant, MSW, LCSW °[facility] at  °[phone]  ° ° °

## 2020-02-19 NOTE — Anesthesia Preprocedure Evaluation (Signed)
Anesthesia Evaluation  Patient identified by MRN, date of birth, ID band Patient awake    Reviewed: Patient's Chart, lab work & pertinent test results  Airway Mallampati: II  TM Distance: >3 FB Neck ROM: Full    Dental  (+) Teeth Intact   Pulmonary neg pulmonary ROS, former smoker,    Pulmonary exam normal        Cardiovascular  Rhythm:Regular Rate:Normal  Supine tachycardia   Neuro/Psych  Headaches, Anxiety Depression    GI/Hepatic negative GI ROS, Neg liver ROS,   Endo/Other  negative endocrine ROS  Renal/GU negative Renal ROS  negative genitourinary   Musculoskeletal negative musculoskeletal ROS (+)   Abdominal (+)  Abdomen: soft. Bowel sounds: normal.  Peds  Hematology negative hematology ROS (+)   Anesthesia Other Findings   Reproductive/Obstetrics (+) Pregnancy [redacted]w[redacted]d di/di twins                             Anesthesia Physical Anesthesia Plan  ASA: II  Anesthesia Plan: Spinal   Post-op Pain Management:    Induction:   PONV Risk Score and Plan: 2 and Ondansetron and Treatment may vary due to age or medical condition  Airway Management Planned: Simple Face Mask and Nasal Cannula  Additional Equipment: None  Intra-op Plan:   Post-operative Plan:   Informed Consent: I have reviewed the patients History and Physical, chart, labs and discussed the procedure including the risks, benefits and alternatives for the proposed anesthesia with the patient or authorized representative who has indicated his/her understanding and acceptance.     Dental advisory given  Plan Discussed with: CRNA  Anesthesia Plan Comments: (Lab Results      Component                Value               Date                      WBC                      7.5                 02/18/2020                HGB                      10.0 (L)            02/18/2020                HCT                      33.3  (L)            02/18/2020                MCV                      71.0 (L)            02/18/2020                PLT                      307                 02/18/2020          )  Anesthesia Quick Evaluation  

## 2020-02-19 NOTE — Transfer of Care (Signed)
Immediate Anesthesia Transfer of Care Note  Patient: Brenda Mcdaniel  Procedure(s) Performed: CESAREAN SECTION MULTI-GESTATIONAL (N/A )  Patient Location: PACU  Anesthesia Type:Spinal  Level of Consciousness: awake, alert  and oriented  Airway & Oxygen Therapy: Patient Spontanous Breathing  Post-op Assessment: Report given to RN and Post -op Vital signs reviewed and stable  Post vital signs: Reviewed and stable  Last Vitals:  Vitals Value Taken Time  BP 105/68 02/19/20 1015  Temp 36.9 C 02/19/20 0851  Pulse 76 02/19/20 1020  Resp 19 02/19/20 1020  SpO2 99 % 02/19/20 1020  Vitals shown include unvalidated device data.  Last Pain:  Vitals:   02/19/20 1000  TempSrc:   PainSc: 0-No pain         Complications: No complications documented.

## 2020-02-19 NOTE — Progress Notes (Signed)
Patient did not call for blood sugar check prior to eating dinner. Asked to call if she has another meal before bedtime and reminded of blood sugar to be checked prior to all meals.

## 2020-02-19 NOTE — Anesthesia Postprocedure Evaluation (Signed)
Anesthesia Post Note  Patient: Brenda Mcdaniel  Procedure(s) Performed: CESAREAN SECTION MULTI-GESTATIONAL (N/A )     Patient location during evaluation: Mother Baby Anesthesia Type: Spinal Level of consciousness: awake, awake and alert and oriented Pain management: pain level controlled Vital Signs Assessment: post-procedure vital signs reviewed and stable Respiratory status: spontaneous breathing and respiratory function stable Cardiovascular status: blood pressure returned to baseline Postop Assessment: no headache, epidural receding, patient able to bend at knees, adequate PO intake, no backache, no apparent nausea or vomiting and able to ambulate Anesthetic complications: no   No complications documented.  Last Vitals:  Vitals:   02/19/20 0537 02/19/20 0851  BP: 129/81 121/75  Pulse: (!) 126 80  Resp: 19 17  Temp: 36.5 C 36.9 C  SpO2: 99% 100%    Last Pain:  Vitals:   02/19/20 0851  TempSrc:   PainSc: 0-No pain   Pain Goal:                Epidural/Spinal Function Cutaneous sensation: No Sensation (02/19/20 0851), Patient able to flex knees: No (02/19/20 0851), Patient able to lift hips off bed: No (02/19/20 0851), Back pain beyond tenderness at insertion site: No (02/19/20 0851), Progressively worsening motor and/or sensory loss: No (02/19/20 0851), Bowel and/or bladder incontinence post epidural: No (02/19/20 0851)  Cleda Clarks

## 2020-02-19 NOTE — Anesthesia Postprocedure Evaluation (Signed)
Anesthesia Post Note  Patient: Brenda Mcdaniel  Procedure(s) Performed: CESAREAN SECTION MULTI-GESTATIONAL (N/A )     Patient location during evaluation: Mother Baby Anesthesia Type: Spinal Level of consciousness: oriented and awake and alert Pain management: pain level controlled Vital Signs Assessment: post-procedure vital signs reviewed and stable Respiratory status: spontaneous breathing and respiratory function stable Cardiovascular status: blood pressure returned to baseline and stable Postop Assessment: no headache, no backache, no apparent nausea or vomiting and able to ambulate Anesthetic complications: no   No complications documented.  Last Vitals:  Vitals:   02/19/20 1015 02/19/20 1029  BP: 105/68 110/69  Pulse: 76 75  Resp: 15 18  Temp:  37 C  SpO2: 99% 98%    Last Pain:  Vitals:   02/19/20 1030  TempSrc:   PainSc: 0-No pain                 Earl Lites P Taesean Reth

## 2020-02-19 NOTE — Brief Op Note (Signed)
02/19/2020  8:31 AM  PATIENT:  Brenda Mcdaniel  34 y.o. female  PRE-OPERATIVE DIAGNOSIS:  DiDi Twins Malpresentation Gestational Diabetes  POST-OPERATIVE DIAGNOSIS:  DiDi Twins Malpresentation  PROCEDURE:  Procedure(s): CESAREAN SECTION MULTI-GESTATIONAL (N/A)  SURGEON:  Surgeon(s) and Role:    * Carrington Clamp, MD - Primary    * Taam-Akelman, Griselda Miner, MD - Assisting  ANESTHESIA:   spinal  EBL: <500cc  SPECIMEN:  Source of Specimen:  placenta  DISPOSITION OF SPECIMEN:  PATHOLOGY  COUNTS:  YES  TOURNIQUET:  * No tourniquets in log *  DICTATION: .Note written in EPIC  PLAN OF CARE: Admit to inpatient   PATIENT DISPOSITION:  PACU - hemodynamically stable.   Delay start of Pharmacological VTE agent (>24hrs) due to surgical blood loss or risk of bleeding: not applicable

## 2020-02-19 NOTE — Anesthesia Procedure Notes (Signed)
Spinal  Patient location during procedure: OR Start time: 02/19/2020 7:38 AM End time: 02/19/2020 7:41 AM Reason for block: at surgeon's request Staffing Performed: anesthesiologist  Anesthesiologist: Atilano Median, DO Preanesthetic Checklist Completed: patient identified, IV checked, site marked, risks and benefits discussed, surgical consent, monitors and equipment checked, pre-op evaluation and timeout performed Spinal Block Patient position: sitting Prep: DuraPrep Patient monitoring: heart rate, cardiac monitor, continuous pulse ox and blood pressure Approach: midline Location: L3-4 Injection technique: single-shot Needle Needle type: Pencan  Needle gauge: 24 G Needle length: 10 cm Assessment Sensory level: T8 Additional Notes Patient identified. Risks/Benefits/Options discussed with patient including but not limited to bleeding, infection, nerve damage, paralysis, failed block, incomplete pain control, headache, blood pressure changes, nausea, vomiting, reactions to medications, itching and postpartum back pain. Confirmed with bedside nurse the patient's most recent platelet count. Confirmed with patient that they are not currently taking any anticoagulation, have any bleeding history or any family history of bleeding disorders. Patient expressed understanding and wished to proceed. All questions were answered. Sterile technique was used throughout the entire procedure. Please see nursing notes for vital signs. Warning signs of high block given to the patient including shortness of breath, tingling/numbness in hands, complete motor block, or any concerning symptoms with instructions to call for help. Patient was given instructions on fall risk and not to get out of bed. All questions and concerns addressed with instructions to call with any issues or inadequate analgesia.

## 2020-02-19 NOTE — H&P (Signed)
34 y.o.  G1P0 [redacted]w[redacted]d with Di/Di twins, Type 2 DM, and VTX/Br presentation. Patient has good fetal movement x2 and no bleeding. Baby B is bigger than A and after all risks, benefits and alternatives, the patient desires primary c/s over induction with possible breech extraction.    Past Medical History:  Diagnosis Date  . Anxiety   . Complication of anesthesia    told heart rate dropped with anesthesia as a child  . Depression   . Dysrhythmia    supine tachycardia has seen cardiologist.  Dr Henderson Cloud has notes  . Headache(784.0)   . Polycystic ovarian disease     Past Surgical History:  Procedure Laterality Date  . TONSILLECTOMY      OB History  Gravida Para Term Preterm AB Living  1            SAB TAB Ectopic Multiple Live Births               # Outcome Date GA Lbr Len/2nd Weight Sex Delivery Anes PTL Lv  1 Current             Social History   Socioeconomic History  . Marital status: Married    Spouse name: Not on file  . Number of children: Not on file  . Years of education: Not on file  . Highest education level: Not on file  Occupational History  . Occupation: therapist  Tobacco Use  . Smoking status: Former Smoker    Years: 7.00    Types: Cigarettes  . Smokeless tobacco: Never Used  . Tobacco comment: 1 Pack a Week  Vaping Use  . Vaping Use: Never used  Substance and Sexual Activity  . Alcohol use: Yes    Alcohol/week: 0.0 standard drinks    Comment: a beer or liquour about 1 x per month  . Drug use: No  . Sexual activity: Yes    Birth control/protection: None  Other Topics Concern  . Not on file  Social History Narrative  . Not on file   Social Determinants of Health   Financial Resource Strain:   . Difficulty of Paying Living Expenses: Not on file  Food Insecurity:   . Worried About Programme researcher, broadcasting/film/video in the Last Year: Not on file  . Ran Out of Food in the Last Year: Not on file  Transportation Needs:   . Lack of Transportation (Medical): Not on  file  . Lack of Transportation (Non-Medical): Not on file  Physical Activity:   . Days of Exercise per Week: Not on file  . Minutes of Exercise per Session: Not on file  Stress:   . Feeling of Stress : Not on file  Social Connections:   . Frequency of Communication with Friends and Family: Not on file  . Frequency of Social Gatherings with Friends and Family: Not on file  . Attends Religious Services: Not on file  . Active Member of Clubs or Organizations: Not on file  . Attends Banker Meetings: Not on file  . Marital Status: Not on file  Intimate Partner Violence:   . Fear of Current or Ex-Partner: Not on file  . Emotionally Abused: Not on file  . Physically Abused: Not on file  . Sexually Abused: Not on file   Bactrim [sulfamethoxazole-trimethoprim]   Prenatal Course: Pt diagnosed with Type 2 DM with failed early one hour and has been on Metformin 1500 mg. Followed by MFM and last [place] at 34 5/7 weeks  was Girl (now B) 2833; Boy (now A)  2620.  MFM recommends delivery by 38 weeks.   Pt was seen by cardiology during pregnancy for palpitations and was dx with sinus tach, otherwise normal.   Prenatal Transfer Tool  Maternal Diabetes: Yes:  Diabetes Type:  Insulin/Medication controlled Genetic Screening: Normal Maternal Ultrasounds/Referrals: Normal Fetal Ultrasounds or other Referrals:  Referred to Materal Fetal Medicine  Maternal Substance Abuse:  No Significant Maternal Medications:  Meds include: Nexium Other:  metformin, lexapro Significant Maternal Lab Results: Group B Strep negative  Vitals:   02/19/20 0537 02/19/20 0548  BP: 129/81   Pulse: (!) 126   Resp: 19   Temp: 97.7 F (36.5 C)   TempSrc: Oral   SpO2: 99%   Weight:  125.5 kg  Height:  5\' 9"  (1.753 m)    Lungs/Cor:  NAD Abdomen:  soft, gravid Ex:  no cords, erythema SVE: FT/80/-3 FHTs:  Present x2  A/P   For primary cesarean sectionat term for Di/Di twins with Breech B and desires c/s.  All  risks, benefits and alternatives discussed with patient and she desires to proceed.  03-30-1974

## 2020-02-20 LAB — GLUCOSE, CAPILLARY
Glucose-Capillary: 70 mg/dL (ref 70–99)
Glucose-Capillary: 78 mg/dL (ref 70–99)
Glucose-Capillary: 55 mg/dL — ABNORMAL LOW (ref 70–99)
Glucose-Capillary: 72 mg/dL (ref 70–99)

## 2020-02-20 LAB — CBC
HCT: 24 % — ABNORMAL LOW (ref 36.0–46.0)
Hemoglobin: 7 g/dL — ABNORMAL LOW (ref 12.0–15.0)
MCH: 20.6 pg — ABNORMAL LOW (ref 26.0–34.0)
MCHC: 29.2 g/dL — ABNORMAL LOW (ref 30.0–36.0)
MCV: 70.8 fL — ABNORMAL LOW (ref 80.0–100.0)
Platelets: 213 10*3/uL (ref 150–400)
RBC: 3.39 MIL/uL — ABNORMAL LOW (ref 3.87–5.11)
RDW: 17.1 % — ABNORMAL HIGH (ref 11.5–15.5)
WBC: 10.1 10*3/uL (ref 4.0–10.5)
nRBC: 0 % (ref 0.0–0.2)

## 2020-02-20 MED ORDER — FERROUS SULFATE 325 (65 FE) MG PO TABS: 325.0000 mg | ORAL_TABLET | Freq: Every day | ORAL | Status: DC

## 2020-02-20 NOTE — Progress Notes (Signed)
Subjective: Postpartum Day 1: Cesarean Delivery Patient reports pain controlled, no nausea or vomiting, tolerating po. Void ing and ambulating without difficulty. Bottle feeding  Objective: Vital signs in last 24 hours: Temp:  [97.7 F (36.5 C)-98.6 F (37 C)] 97.7 F (36.5 C) (11/03 0425) Pulse Rate:  [61-75] 70 (11/03 0425) Resp:  [16-20] 16 (11/03 0425) BP: (97-127)/(56-76) 97/69 (11/03 0425) SpO2:  [96 %-100 %] 100 % (11/03 0425)  Vitals:   02/19/20 1953 02/19/20 2316 02/20/20 0133 02/20/20 0425  BP: 107/62 (!) 99/56  97/69  Pulse: 71 61  70  Resp: 18 19  16   Temp: 98.3 F (36.8 C) 98.2 F (36.8 C)  97.7 F (36.5 C)  TempSrc: Oral Oral  Oral  SpO2: 100% 100% 100% 100%  Weight:      Height:         Physical Exam:  General: alert, cooperative and appears stated age Lochia: appropriate Uterine Fundus: firm Incision: healing well, dressing with soilage on the lower left side. Does not appear to be expanding DVT Evaluation: No evidence of DVT seen on physical exam.  Recent Labs    02/18/20 1030 02/20/20 0550  HGB 10.0* 7.0*  HCT 33.3* 24.0*   CBG (last 3)  Recent Labs    02/19/20 2302 02/19/20 2323 02/20/20 1022  GLUCAP 50* 76 70     Assessment/Plan: Status post Cesarean section. Doing well postoperatively.  Continue current care. A2DM: failed early 1 hr. First trimester HgbA1c 5.7% BS controlled on metformin 1500mg  during pregnancy. Metformin stopped PP. So far BS have all been in normal range. Blood loss anemia: Pt asymptomatic. Will continue supplemental iron in addition to PNV Desires neonatal circumcision, R/B/A of procedure discussed at length. Pt understands that neonatal circumcision is not considered medically necessary and is elective. The risks include, but are not limited to bleeding, infection, damage to the penis, development of scar tissue, and having to have it redone at a later date. Pt understands theses risks and wishes to  proceed   13/03/21 02/20/2020, 11:09 AM

## 2020-02-20 NOTE — Progress Notes (Signed)
Hypoglycemic Event  CBG: 50 @2302   Treatment: 8 oz. Of juice given per hypoglycemia protocol.  Symptoms: Pt c/o feeling "woozy". Pt appears clammy.   Follow-up CBG: Time: 2323 CBG Result:76  Possible Reasons for Event: Pt reports she had not eaten anything in a while. Pt reports feeling normal after drinking juice. Blood glucose now WDL.  2324

## 2020-02-20 NOTE — Progress Notes (Addendum)
Hypoglycemic Event  CBG: 55 @ 2113  Treatment: 4 oz apple juice given to patient Symptoms: Pt asymptomatic  Follow-up CBG: Time: 2134 CBG Result: 72  Possible Reasons for Event: Pt states she hasn't eaten anything since around 1600.    Brenda Mcdaniel

## 2020-02-21 ENCOUNTER — Encounter (HOSPITAL_COMMUNITY): Payer: Self-pay | Admitting: Obstetrics and Gynecology

## 2020-02-21 LAB — SURGICAL PATHOLOGY

## 2020-02-21 MED ORDER — OXYCODONE-ACETAMINOPHEN 5-325 MG PO TABS
1.0000 | ORAL_TABLET | ORAL | 0 refills | Status: DC | PRN
Start: 2020-02-21 — End: 2020-07-31

## 2020-02-21 NOTE — Discharge Summary (Signed)
Postpartum Discharge Summary  Date of Service updated      Patient Name: Brenda Mcdaniel DOB: Nov 25, 1985 MRN: [id number]  Date of admission: 02/19/2020 Delivery date:   Brenda Mcdaniel [[id number]]  02/19/2020    Brenda Mcdaniel [[id number]]  02/19/2020   Delivering provider:    Harle Stanford [[id number]]  [facility], [name]    Brenda Mcdaniel [[id number]]  Brenda Mcdaniel   Date of discharge: 02/21/2020  Admitting diagnosis: Malpresentation before onset of labor, fetus 1 [O32.9XX1] Intrauterine pregnancy: [redacted]w[redacted]d     Secondary diagnosis:  Active Problems:   Malpresentation before onset of labor, fetus 1  Additional problems: Type 2 DM, Di/Di twins    Discharge diagnosis: Term Pregnancy Delivered and Type 2 DM                                              Post partum procedures:none Augmentation: N/A Complications: None  Hospital course: Sceduled C/S   34 y.o. yo G1P0 at [redacted]w[redacted]d was admitted to the hospital 02/19/2020 for scheduled cesarean section with the following indication:Malpresentation.Delivery details are as follows:  Membrane Rupture Time/Date:    Brenda Mcdaniel [[id number]]  8:00 AM    Brenda Mcdaniel [[id number]]  8:02 AM  ,   Brenda Mcdaniel [[id number]]  02/19/2020    Brenda Mcdaniel [[id number]]  02/19/2020    Delivery Method:   Harle Stanford [[id number]]  C-Section, Low Transverse    Brenda Mcdaniel [[id number]]  C-Section, Low Transverse   Details of operation can be found in separate operative note.  Patient had an uncomplicated postpartum course.  She is ambulating, tolerating a regular diet, passing flatus, and urinating well. Patient is discharged home in stable condition on  02/21/20        Newborn Data: Birth date:   Brenda Mcdaniel [[id number]]  02/19/2020    Brenda Mcdaniel [[id number]]  02/19/2020   Birth time:   Brenda Mcdaniel [[id number]]  8:00 AM    Brenda Mcdaniel  [[id number]]  8:02 AM   Gender:   Brenda Mcdaniel [[id number]]  Female    Brenda Mcdaniel [[id number]]  Female   Living status:   Brenda Mcdaniel [[id number]]  Living    Brenda Mcdaniel [[id number]]  Living   Apgars:   Brenda Mcdaniel [[id number]]  6    Brenda Mcdaniel [[id number]]  1  ,   Brenda Mcdaniel [[id number]]  7    Brenda Mcdaniel [[id number]]  9   Weight:   Brenda Mcdaniel [[id number]]  3140 g    Brenda Mcdaniel [[id number]]  3130 g      Magnesium Sulfate received: No BMZ received: No Rhophylac:No MMR:No T-DaP:Given prenatally Flu: No Transfusion:No  Physical exam  Vitals:   02/20/20 0425 02/20/20 1458 02/20/20 2152 02/21/20 0509  BP: 97/69 121/65 114/70 116/60  Pulse: 70 81 72 69  Resp: $Remo'16 19  18  'XaCLf$ Temp: 97.7 F (36.5 C) 98.8 F (37.1 C) 98.8 F (37.1 C) 98.4 F (36.9 C)  TempSrc: Oral Axillary Oral Oral  SpO2: 100%  100%   Weight:      Height:        Labs: Lab Results  Component Value Date   WBC 10.1 02/20/2020   HGB 7.0 (L) 02/20/2020   HCT 24.0 (L) 02/20/2020  MCV 70.8 (L) 02/20/2020   PLT 213 02/20/2020   No flowsheet data found. Edinburgh Score: Edinburgh Postnatal Depression Scale Screening Tool 02/20/2020  I have been able to laugh and see the funny side of things. 0  I have looked forward with enjoyment to things. 0  I have blamed myself unnecessarily when things went wrong. 1  I have been anxious or worried for no good reason. 2  I have felt scared or panicky for no good reason. 1  Things have been getting on top of me. 1  I have been so unhappy that I have had difficulty sleeping. 0  I have felt sad or miserable. 0  I have been so unhappy that I have been crying. 0  The thought of harming myself has occurred to me. 0  Edinburgh Postnatal Depression Scale Total 5      After visit meds:  Allergies as of 02/21/2020      Reactions   Bactrim [sulfamethoxazole-trimethoprim]  Itching   Yeast infection      Medication List    STOP taking these medications   metFORMIN 500 MG tablet Commonly known as: GLUCOPHAGE     TAKE these medications   buPROPion 150 MG 24 hr tablet Commonly known as: WELLBUTRIN XL Take 150 mg by mouth daily.   escitalopram 20 MG tablet Commonly known as: LEXAPRO Take 20 mg by mouth daily.   folic acid 1 MG tablet Commonly known as: FOLVITE Take 1 mg by mouth daily.   NexIUM 20 MG capsule Generic drug: esomeprazole Take 20 mg by mouth in the morning and at bedtime.   oxyCODONE-acetaminophen 5-325 MG tablet Commonly known as: PERCOCET/ROXICET Take 1-2 tablets by mouth every 4 (four) hours as needed for moderate pain.   prenatal multivitamin Tabs tablet Take 1 tablet by mouth daily at 12 noon. Plus DHA            Discharge Care Instructions  (From admission, onward)         Start     Ordered   02/21/20 0000  Discharge wound care:       Comments: Sitz baths and icepacks to perineum.  If stitches, they will dissolve.   02/21/20 0744           Discharge home in stable condition Infant Feeding: ? Infant Disposition:home with mother Discharge instruction: per After Visit Summary and Postpartum booklet. Activity: Advance as tolerated. Pelvic rest for 6 weeks.  Diet: carb modified diet Anticipated Birth Control: Unsure Postpartum Appointment:4 weeks Additional Postpartum F/U: none Future Appointments: Future Appointments  Date Time Provider [place]  05/13/2020  3:00 PM O'Neal, Cassie Freer, MD [facility]   Follow up Visit:  Follow-up Information    Brenda Charleston, MD Follow up in 4 week(s).   Specialty: Obstetrics and Gynecology Contact information: [address] [place]. [name] [phone]                   02/21/2020 Daria Pastures, MD

## 2020-02-21 NOTE — Progress Notes (Signed)
  Patient is eating, ambulating, voiding.  Pain control is good.  Vitals:   02/20/20 0425 02/20/20 1458 02/20/20 2152 02/21/20 0509  BP: 97/69 121/65 114/70 116/60  Pulse: 70 81 72 69  Resp: 16 19  18   Temp: 97.7 F (36.5 C) 98.8 F (37.1 C) 98.8 F (37.1 C) 98.4 F (36.9 C)  TempSrc: Oral Axillary Oral Oral  SpO2: 100%  100%   Weight:      Height:        lungs:   clear to auscultation cor:    RRR Abdomen:  soft, appropriate tenderness, incisions intact and without erythema or exudate ex:    no cords   Lab Results  Component Value Date   WBC 10.1 02/20/2020   HGB 7.0 (L) 02/20/2020   HCT 24.0 (L) 02/20/2020   MCV 70.8 (L) 02/20/2020   PLT 213 02/20/2020    --/--/A POS (11/01 0955)/RI  A/P    Post operative day 2.  Routine post op and postpartum care.  Expect d/c today.  Percocet for pain control.

## 2020-05-13 ENCOUNTER — Ambulatory Visit: Payer: BC Managed Care – PPO | Admitting: Cardiovascular Disease

## 2020-07-18 ENCOUNTER — Telehealth: Payer: Self-pay | Admitting: Family Medicine

## 2020-07-18 NOTE — Telephone Encounter (Signed)
Called pt to schedule apt- referral in proficient-- pt is not new to [facility] [facility]

## 2020-07-30 NOTE — Progress Notes (Addendum)
Cardiology Office Note  Date: 07/31/2020   ID: Brenda Mcdaniel, DOB [birth year], MRN [id number]  PCP:  Selinda Flavin, MD  Cardiologist:  Reatha Harps, MD Electrophysiologist:  None   Chief Complaint: Palpitations  History of Present Illness: Brenda Mcdaniel is a 35 y.o. female with a history of palpitations.  History of PCOS/DM2.  Low back pain.  Anxiety/depression.  Previously seen by Dr Flora Lipps 02/11/2020. She reported since being pregnant she was having of episodes of  heart rate increasing rapidly. Stated lying on her back or certain movements could cause HR to go up to 170's. She denied any syncope but did feel a little dizzy . EKG demonstrated sinus tachycardia with HR 165. Subsequent EKG HR 132. Later EKG in office HR 119. All were sinus tachycardia. He suspected there was some compressive issue from pregnancy causing the tachycardia and delivery would resolve the issue. He ordered thyroid labs. Plan was to see her back in 3 months. She was having borderline anemia and followed by OB-GYN. Thyroid labs were normal.  She is here today with complaints of palpitations.  States the palpitations occur 2-3 times per week.  Brief in duration without any accompanying symptoms.  They are not associated with any activity.  They can occur at random.  She was having these palpitations during her pregnancy.  And saw Dr. Bufford Buttner as noted above in October 2021.  Heart rate is controlled at a rate of 76 today.  EKG today shows normal sinus rhythm rate of 71 without ectopy.  She denies any orthostatic symptoms i.e. lightheadedness, dizziness, presyncope or syncope, CVA or TIA-like symptoms, PND, orthopnea.  No bleeding issues.  No claudication-like symptoms, DVT or PE-like symptoms, or lower extremity edema.  Patient states her PCP recently checked her thyroid function and thyroid levels were normal.   Past Medical History:  Diagnosis Date  . Anxiety   . Complication of anesthesia    told heart rate  dropped with anesthesia as a child  . Depression   . Dysrhythmia    supine tachycardia has seen cardiologist.  Dr Henderson Cloud has notes  . Headache(784.0)   . Polycystic ovarian disease     Past Surgical History:  Procedure Laterality Date  . CESAREAN SECTION MULTI-GESTATIONAL N/A 02/19/2020   Procedure: CESAREAN SECTION MULTI-GESTATIONAL;  Surgeon: Carrington Clamp, MD;  Location: [facility] LD ORS;  Service: Obstetrics;  Laterality: N/A;  . TONSILLECTOMY      Current Outpatient Medications  Medication Sig Dispense Refill  . baclofen (LIORESAL) 10 MG tablet Take 10 mg by mouth 3 (three) times daily as needed.    Marland Kitchen buPROPion (WELLBUTRIN XL) 150 MG 24 hr tablet Take 150 mg by mouth daily.     Marland Kitchen escitalopram (LEXAPRO) 20 MG tablet Take 20 mg by mouth daily.    . folic acid (FOLVITE) 1 MG tablet Take 1 mg by mouth daily.    . metFORMIN (GLUCOPHAGE-XR) 500 MG 24 hr tablet Take 500 mg by mouth 2 (two) times daily.    . Prenatal Vit-Fe Fumarate-FA (PRENATAL MULTIVITAMIN) TABS tablet Take 1 tablet by mouth daily at 12 noon. Plus DHA     No current facility-administered medications for this visit.   Allergies:  Bactrim [sulfamethoxazole-trimethoprim]   Social History: The patient  reports that she has quit smoking. Her smoking use included cigarettes. She quit after 7.00 years of use. She has never used smokeless tobacco. She reports current alcohol use. She reports that she does not use drugs.  Family History: The patient's family history includes Alcohol abuse in her father; Depression in her maternal aunt, mother, paternal aunt, and paternal uncle; Diabetes in her maternal grandfather; Schizophrenia in her father and maternal grandfather.   ROS:  Please see the history of present illness. Otherwise, complete review of systems is positive for none.  All other systems are reviewed and negative.   Physical Exam: VS:  BP 118/78   Pulse 76   Ht 5\' 9"  (1.753 m)   Wt 250 lb 9.6 oz (113.7 kg)   SpO2  98%   BMI 37.01 kg/m , BMI Body mass index is 37.01 kg/m.  Wt Readings from Last 3 Encounters:  07/31/20 250 lb 9.6 oz (113.7 kg)  02/19/20 276 lb 9.6 oz (125.5 kg)  02/12/20 270 lb (122.5 kg)    General: Obese patient appears comfortable at rest. Neck: Supple, no elevated JVP or carotid bruits, no thyromegaly. Lungs: Clear to auscultation, nonlabored breathing at rest. Cardiac: Regular rate and rhythm, no S3 or significant systolic murmur, no pericardial rub. Extremities: No pitting edema, distal pulses 2+. Skin: Warm and dry. Musculoskeletal: No kyphosis. Neuropsychiatric: Alert and oriented x3, affect grossly appropriate.  ECG:  An ECG dated 07/31/2020 was personally reviewed today and demonstrated:  Normal sinus rhythm rate of 71.  Recent Labwork: 02/11/2020: TSH 2.360 02/20/2020: Hemoglobin 7.0; Platelets 213  No results found for: CHOL, TRIG, HDL, CHOLHDL, VLDL, LDLCALC, LDLDIRECT  Other Studies Reviewed Today:   Assessment and Plan:  1. Sinus tachycardia   2. Palpitations    1. Sinus tachycardia Previous episodes of sinus tachycardia and saw Dr. 13/06/2019.  He believes the sinus tachycardia to be related to compressive syndrome due to her pregnancy.  He stated this would likely resolve after delivery.  She has since delivered and denies any episodes of sinus tachycardia.  2. Palpitations Currently has complaints of palpitations which occur once to several times per week which are short-lived.  She denies any accompanying symptoms such as lightheadedness, dizziness, presyncope or syncope.  Please get a 14-day ZIO monitor.  Medication Adjustments/Labs and Tests Ordered: Current medicines are reviewed at length with the patient today.  Concerns regarding medicines are outlined above.   Disposition: Follow-up with Dr. Flora Lipps or APP 6 to 8 weeks  Signed, Wyline Mood, NP 07/31/2020 9:02 AM    [facility] [address] [place], [place], [place]  [place] Phone: [phone]; Fax: [phone]

## 2020-07-31 ENCOUNTER — Encounter: Payer: Self-pay | Admitting: Family Medicine

## 2020-07-31 ENCOUNTER — Ambulatory Visit: Payer: BC Managed Care – PPO | Admitting: Family Medicine

## 2020-07-31 ENCOUNTER — Other Ambulatory Visit: Payer: Self-pay | Admitting: Cardiology

## 2020-07-31 ENCOUNTER — Ambulatory Visit (INDEPENDENT_AMBULATORY_CARE_PROVIDER_SITE_OTHER): Payer: BC Managed Care – PPO

## 2020-07-31 ENCOUNTER — Other Ambulatory Visit: Payer: Self-pay

## 2020-07-31 ENCOUNTER — Telehealth: Payer: Self-pay | Admitting: Family Medicine

## 2020-07-31 VITALS — BP 118/78 | HR 76 | Ht 69.0 in | Wt 250.6 lb

## 2020-07-31 DIAGNOSIS — R002 Palpitations: Secondary | ICD-10-CM

## 2020-07-31 DIAGNOSIS — R000 Tachycardia, unspecified: Secondary | ICD-10-CM | POA: Diagnosis not present

## 2020-07-31 NOTE — Addendum Note (Signed)
Addended by: Lesle Chris on: 07/31/2020 09:50 AM   Modules accepted: Orders

## 2020-07-31 NOTE — Patient Instructions (Signed)
Medication Instructions:  Continue all current medications.  Labwork: none  Testing/Procedures:  Your physician has recommended that you wear a 14 day event monitor. Event monitors are medical devices that record the heart's electrical activity. Doctors most often us these monitors to diagnose arrhythmias. Arrhythmias are problems with the speed or rhythm of the heartbeat. The monitor is a small, portable device. You can wear one while you do your normal daily activities. This is usually used to diagnose what is causing palpitations/syncope (passing out).  Office will contact with results via phone or letter.    Follow-Up: 6-8 weeks   Any Other Special Instructions Will Be Listed Below (If Applicable).  If you need a refill on your cardiac medications before your next appointment, please call your pharmacy.  

## 2020-07-31 NOTE — Telephone Encounter (Signed)
14 day zio - palps  PERCERT  

## 2020-08-02 DIAGNOSIS — R002 Palpitations: Secondary | ICD-10-CM | POA: Diagnosis not present

## 2020-09-03 ENCOUNTER — Telehealth: Payer: Self-pay | Admitting: *Deleted

## 2020-09-03 NOTE — Telephone Encounter (Signed)
Patient informed. Copy sent to PCP °

## 2020-09-03 NOTE — Telephone Encounter (Signed)
-----   Message from Creig Hines, NP sent at 09/02/2020  4:06 PM EDT ----- Rare extra beats coming from the top and bottom chambers accounting for <1% of beats.  Symptoms sometimes correlated w/ those extra beats.  These are not dangerous and given overall lack of beats, do not require any treatment at this time.

## 2020-09-10 NOTE — Progress Notes (Signed)
Cardiology Office Note  Date: 09/11/2020   ID: Brenda Mcdaniel, DOB [birth year], MRN [id number]  PCP:  Lianne Moris, PA-C  Cardiologist:  Reatha Harps, MD Electrophysiologist:  None   Chief Complaint: Palpitations  History of Present Illness: Brenda Mcdaniel is a 35 y.o. female with a history of palpitations.  History of PCOS/DM2.  Low back pain.  Anxiety/depression.  Previously seen by Dr Flora Lipps 02/11/2020. She reported since being pregnant she was having of episodes of  heart rate increasing rapidly. Stated lying on her back or certain movements could cause HR to go up to 170's. She denied any syncope but did feel a little dizzy . EKG demonstrated sinus tachycardia with HR 165. Subsequent EKG HR 132. Later EKG in office HR 119. All were sinus tachycardia. He suspected there was some compressive issue from pregnancy causing the tachycardia and delivery would resolve the issue. He ordered thyroid labs. Plan was to see her back in 3 months. She was having borderline anemia and followed by OB-GYN. Thyroid labs were normal.  She was last here with complaints of palpitations.  Stated the palpitations occur 2-3 times per week.  Brief in duration without any accompanying symptoms.  They were not associated with any activity.  They could occur at random.  She was having these palpitations during her pregnancy and saw Dr. Flora Lipps as noted above in October 2021.  Heart rate was controlled at a rate of 76 .  EKG showed normal sinus rhythm rate of 71 without ectopy.  She denied any orthostatic symptoms i.e. lightheadedness, dizziness, presyncope or syncope, CVA or TIA-like symptoms, PND, orthopnea.  No bleeding issues.  No claudication-like symptoms, DVT or PE-like symptoms, or lower extremity edema.  Her PCP had recently checked her thyroid function and thyroid levels were normal.  She is here for follow-up and review of recent ZIO monitor.  She states she has had a couple of episodes of palpitations  since monitor.  She had 1 episode during the monitor when she was walking her child in a stroller and heart rate became fast but eventually resolved.  We reviewed the results of her monitor.  Otherwise she denies any anginal or exertional symptoms, orthostatic symptoms, CVA or TIA-like symptoms, PND, orthopnea, bleeding issues, claudication-like symptoms, DVT or PE-like symptoms, or lower extremity edema.   Past Medical History:  Diagnosis Date  . Anxiety   . Complication of anesthesia    told heart rate dropped with anesthesia as a child  . Depression   . Dysrhythmia    supine tachycardia has seen cardiologist.  Dr Henderson Cloud has notes  . Headache(784.0)   . Polycystic ovarian disease     Past Surgical History:  Procedure Laterality Date  . CESAREAN SECTION MULTI-GESTATIONAL N/A 02/19/2020   Procedure: CESAREAN SECTION MULTI-GESTATIONAL;  Surgeon: Carrington Clamp, MD;  Location: [facility] LD ORS;  Service: Obstetrics;  Laterality: N/A;  . TONSILLECTOMY      Current Outpatient Medications  Medication Sig Dispense Refill  . baclofen (LIORESAL) 10 MG tablet Take 10 mg by mouth 3 (three) times daily as needed.    Marland Kitchen buPROPion (WELLBUTRIN XL) 150 MG 24 hr tablet Take 150 mg by mouth daily.     Marland Kitchen escitalopram (LEXAPRO) 20 MG tablet Take 20 mg by mouth daily.    . folic acid (FOLVITE) 1 MG tablet Take 1 mg by mouth daily.    . metFORMIN (GLUCOPHAGE-XR) 500 MG 24 hr tablet Take 500 mg by mouth 2 (two)  times daily.    . Prenatal Vit-Fe Fumarate-FA (PRENATAL MULTIVITAMIN) TABS tablet Take 1 tablet by mouth daily at 12 noon. Plus DHA     No current facility-administered medications for this visit.   Allergies:  Bactrim [sulfamethoxazole-trimethoprim]   Social History: The patient  reports that she has quit smoking. Her smoking use included cigarettes. She quit after 7.00 years of use. She has never used smokeless tobacco. She reports current alcohol use. She reports that she does not use drugs.    Family History: The patient's family history includes Alcohol abuse in her father; Depression in her maternal aunt, mother, paternal aunt, and paternal uncle; Diabetes in her maternal grandfather; Schizophrenia in her father and maternal grandfather.   ROS:  Please see the history of present illness. Otherwise, complete review of systems is positive for none.  All other systems are reviewed and negative.   Physical Exam: VS:  BP 116/70   Pulse 74   Ht 5\' 9"  (1.753 m)   Wt 254 lb 6.4 oz (115.4 kg)   SpO2 98%   BMI 37.57 kg/m , BMI Body mass index is 37.57 kg/m.  Wt Readings from Last 3 Encounters:  09/11/20 254 lb 6.4 oz (115.4 kg)  07/31/20 250 lb 9.6 oz (113.7 kg)  02/19/20 276 lb 9.6 oz (125.5 kg)    General: Obese patient appears comfortable at rest. Neck: Supple, no elevated JVP or carotid bruits, no thyromegaly. Lungs: Clear to auscultation, nonlabored breathing at rest. Cardiac: Regular rate and rhythm, no S3 or significant systolic murmur, no pericardial rub. Extremities: No pitting edema, distal pulses 2+. Skin: Warm and dry. Musculoskeletal: No kyphosis. Neuropsychiatric: Alert and oriented x3, affect grossly appropriate.  ECG:  An ECG dated 07/31/2020 was personally reviewed today and demonstrated:  Normal sinus rhythm rate of 71.  Recent Labwork: 02/11/2020: TSH 2.360 02/20/2020: Hemoglobin 7.0; Platelets 213  No results found for: CHOL, TRIG, HDL, CHOLHDL, VLDL, LDLCALC, LDLDIRECT  Other Studies Reviewed Today:  Cardiac monitor 09/02/2020 Patient had a min HR of 45 bpm, max HR of 172 bpm, and avg HR of 89 bpm. Predominant underlying rhythm was Sinus Rhythm. Isolated SVEs were rare (<1.0%), SVE Couplets were rare (<1.0%), and no SVE Triplets were present. Isolated VEs were rare (<1.0%), VE Couplets were rare (<1.0%), and no VE Triplets were present.  Assessment and Plan:  1. Sinus tachycardia   2. Palpitations    1. Sinus tachycardia Previous episodes of  sinus tachycardia and saw Dr. 09/04/2020.  He believed the sinus tachycardia to be related to compressive syndrome due to her pregnancy.  He stated this would likely resolve after delivery.     2. Palpitations At last visit she had complaints of palpitations which occurred once to several times per week which were short-lived.  Recent ZIO monitor showed no sustained arrhythmias.  She states she had 1 episode rapid heart rate when she was walking her child in her stroller..  She states she has had a couple of subsequent episodes mostly when active.  Please start Cardizem 30 mg p.o. twice daily as needed for palpitations.  Medication Adjustments/Labs and Tests Ordered: Current medicines are reviewed at length with the patient today.  Concerns regarding medicines are outlined above.   Disposition: Follow-up with Dr. Flora Lipps or APP 6 months  Signed, Wyline Mood, NP 09/11/2020 9:28 AM    [facility] at [facility] [address] [place], [place], [place] Phone: [phone]; Fax: [phone]

## 2020-09-11 ENCOUNTER — Encounter: Payer: Self-pay | Admitting: Family Medicine

## 2020-09-11 ENCOUNTER — Ambulatory Visit: Payer: BC Managed Care – PPO | Admitting: Family Medicine

## 2020-09-11 VITALS — BP 116/70 | HR 74 | Ht 69.0 in | Wt 254.4 lb

## 2020-09-11 DIAGNOSIS — R002 Palpitations: Secondary | ICD-10-CM | POA: Diagnosis not present

## 2020-09-11 DIAGNOSIS — R000 Tachycardia, unspecified: Secondary | ICD-10-CM

## 2020-09-11 MED ORDER — DILTIAZEM HCL 30 MG PO TABS: 30.0000 mg | ORAL_TABLET | Freq: Two times a day (BID) | ORAL | 2 refills | Status: DC | PRN

## 2020-09-11 NOTE — Patient Instructions (Addendum)
Medication Instructions:    Your physician has recommended you make the following change in your medication:   Start diltiazem 30 mg by mouth twice daily as needed for palpitations  Continue other medications the same  Labwork:   none  Testing/Procedures:  none  Follow-Up:  Your physician recommends that you schedule a follow-up appointment in: 6 months  Any Other Special Instructions Will Be Listed Below (If Applicable).  If you need a refill on your cardiac medications before your next appointment, please call your pharmacy.

## 2021-10-16 LAB — OB RESULTS CONSOLE GC/CHLAMYDIA
Chlamydia: NEGATIVE
Neisseria Gonorrhea: NEGATIVE

## 2021-11-04 LAB — OB RESULTS CONSOLE ANTIBODY SCREEN: Antibody Screen: NEGATIVE

## 2021-11-04 LAB — OB RESULTS CONSOLE RPR: RPR: NONREACTIVE

## 2021-11-04 LAB — OB RESULTS CONSOLE HIV ANTIBODY (ROUTINE TESTING): HIV: NONREACTIVE

## 2021-11-04 LAB — OB RESULTS CONSOLE RUBELLA ANTIBODY, IGM: Rubella: IMMUNE

## 2021-11-04 LAB — OB RESULTS CONSOLE HEPATITIS B SURFACE ANTIGEN: Hepatitis B Surface Ag: NEGATIVE

## 2021-11-04 LAB — OB RESULTS CONSOLE ABO/RH: RH Type: POSITIVE

## 2021-11-04 LAB — HEPATITIS C ANTIBODY: HCV Ab: NEGATIVE

## 2022-04-27 LAB — OB RESULTS CONSOLE GBS: GBS: NEGATIVE

## 2022-05-04 ENCOUNTER — Encounter (HOSPITAL_COMMUNITY): Payer: Self-pay | Admitting: *Deleted

## 2022-05-04 ENCOUNTER — Telehealth (HOSPITAL_COMMUNITY): Payer: Self-pay | Admitting: *Deleted

## 2022-05-04 NOTE — Patient Instructions (Signed)
[name]  05/04/2022   Your procedure is scheduled on:  05/18/2022  Arrive at [place] at Entrance C on [address] at [facility]  and [employer]. You are invited to use the FREE valet parking or use the Visitor's parking deck.  Pick up the phone at the desk and dial [phone].  Call this number if you have problems the morning of surgery: [phone]  Remember:   Do not eat food:(After Midnight) Desps de medianoche.  Do not drink clear liquids: (After Midnight) Desps de medianoche.  Take these medicines the morning of surgery with A SIP OF WATER:  Take lexapro and wellbutrin as prescribed   Do not wear jewelry, make-up or nail polish.  Do not wear lotions, powders, or perfumes. Do not wear deodorant.  Do not shave 48 hours prior to surgery.  Do not bring valuables to the hospital.  [facility] is not   responsible for any belongings or valuables brought to the hospital.  Contacts, dentures or bridgework may not be worn into surgery.  Leave suitcase in the car. After surgery it may be brought to your room.  For patients admitted to the hospital, checkout time is 11:00 AM the day of              discharge.      Please read over the following fact sheets that you were given:     Preparing for Surgery

## 2022-05-04 NOTE — Telephone Encounter (Signed)
Preadmission screen  

## 2022-05-17 ENCOUNTER — Encounter (HOSPITAL_COMMUNITY)
Admission: RE | Admit: 2022-05-17 | Discharge: 2022-05-17 | Disposition: A | Discharge status: Home / Self Care | Payer: BC Managed Care – PPO | Source: Ambulatory Visit | Attending: Obstetrics | Admitting: Obstetrics

## 2022-05-17 DIAGNOSIS — Z01812 Encounter for preprocedural laboratory examination: Secondary | ICD-10-CM | POA: Insufficient documentation

## 2022-05-17 DIAGNOSIS — Z98891 History of uterine scar from previous surgery: Secondary | ICD-10-CM

## 2022-05-17 LAB — TYPE AND SCREEN
ABO/RH(D): A POS
Antibody Screen: NEGATIVE

## 2022-05-17 LAB — CBC
HCT: 41.3 % (ref 36.0–46.0)
Hemoglobin: 12.9 g/dL (ref 12.0–15.0)
MCH: 25.4 pg — ABNORMAL LOW (ref 26.0–34.0)
MCHC: 31.2 g/dL (ref 30.0–36.0)
MCV: 81.3 fL (ref 80.0–100.0)
Platelets: 292 10*3/uL (ref 150–400)
RBC: 5.08 MIL/uL (ref 3.87–5.11)
RDW: 16.2 % — ABNORMAL HIGH (ref 11.5–15.5)
WBC: 8.1 10*3/uL (ref 4.0–10.5)
nRBC: 0 % (ref 0.0–0.2)

## 2022-05-17 NOTE — Anesthesia Preprocedure Evaluation (Signed)
Anesthesia Evaluation  Patient identified by MRN, date of birth, ID band Patient awake    Reviewed: Patient's Chart, lab work & pertinent test results  History of Anesthesia Complications (+) history of anesthetic complications  Airway Mallampati: II  TM Distance: >3 FB Neck ROM: Full    Dental  (+) Teeth Intact   Pulmonary neg pulmonary ROS, former smoker   Pulmonary exam normal        Cardiovascular + dysrhythmias  Rhythm:Regular Rate:Normal  Supine tachycardia   Neuro/Psych  Headaches PSYCHIATRIC DISORDERS Anxiety Depression       GI/Hepatic negative GI ROS, Neg liver ROS,,,  Endo/Other  negative endocrine ROS    Renal/GU negative Renal ROS  negative genitourinary   Musculoskeletal negative musculoskeletal ROS (+)    Abdominal  (+)  Abdomen: soft. Bowel sounds: normal.  Peds  Hematology negative hematology ROS (+)   Anesthesia Other Findings   Reproductive/Obstetrics (+) Pregnancy [redacted]w[redacted]d di/di twins                             Anesthesia Physical Anesthesia Plan  ASA: 2  Anesthesia Plan: Spinal   Post-op Pain Management:    Induction:   PONV Risk Score and Plan: 2 and Ondansetron, Treatment may vary due to age or medical condition and Scopolamine patch - Pre-op  Airway Management Planned: Simple Face Mask and Nasal Cannula  Additional Equipment: None  Intra-op Plan:   Post-operative Plan:   Informed Consent: I have reviewed the patients History and Physical, chart, labs and discussed the procedure including the risks, benefits and alternatives for the proposed anesthesia with the patient or authorized representative who has indicated his/her understanding and acceptance.     Dental advisory given  Plan Discussed with: CRNA  Anesthesia Plan Comments:         Anesthesia Quick Evaluation

## 2022-05-18 ENCOUNTER — Inpatient Hospital Stay (HOSPITAL_COMMUNITY): Payer: BC Managed Care – PPO | Admitting: Anesthesiology

## 2022-05-18 ENCOUNTER — Encounter (HOSPITAL_COMMUNITY)
Admission: AD | Disposition: A | Discharge status: Home / Self Care | Payer: Self-pay | Source: Home / Self Care | Attending: Obstetrics

## 2022-05-18 ENCOUNTER — Other Ambulatory Visit: Payer: Self-pay

## 2022-05-18 ENCOUNTER — Encounter (HOSPITAL_COMMUNITY): Payer: Self-pay | Admitting: Obstetrics

## 2022-05-18 ENCOUNTER — Inpatient Hospital Stay (HOSPITAL_COMMUNITY)
Admission: AD | Admit: 2022-05-18 | Discharge: 2022-05-20 | DRG: 787 | Disposition: A | Discharge status: Home / Self Care | Payer: BC Managed Care – PPO | Attending: Obstetrics | Admitting: Obstetrics

## 2022-05-18 DIAGNOSIS — O34211 Maternal care for low transverse scar from previous cesarean delivery: Principal | ICD-10-CM | POA: Diagnosis present

## 2022-05-18 DIAGNOSIS — O24425 Gestational diabetes mellitus in childbirth, controlled by oral hypoglycemic drugs: Secondary | ICD-10-CM | POA: Diagnosis present

## 2022-05-18 DIAGNOSIS — O9902 Anemia complicating childbirth: Secondary | ICD-10-CM | POA: Diagnosis present

## 2022-05-18 DIAGNOSIS — D62 Acute posthemorrhagic anemia: Secondary | ICD-10-CM | POA: Diagnosis not present

## 2022-05-18 DIAGNOSIS — Z3A39 39 weeks gestation of pregnancy: Secondary | ICD-10-CM

## 2022-05-18 DIAGNOSIS — Z87891 Personal history of nicotine dependence: Secondary | ICD-10-CM

## 2022-05-18 DIAGNOSIS — Z98891 History of uterine scar from previous surgery: Principal | ICD-10-CM

## 2022-05-18 LAB — CBC
HCT: 32.8 % — ABNORMAL LOW (ref 36.0–46.0)
Hemoglobin: 10.5 g/dL — ABNORMAL LOW (ref 12.0–15.0)
MCH: 25.6 pg — ABNORMAL LOW (ref 26.0–34.0)
MCHC: 32 g/dL (ref 30.0–36.0)
MCV: 80 fL (ref 80.0–100.0)
Platelets: 227 10*3/uL (ref 150–400)
RBC: 4.1 MIL/uL (ref 3.87–5.11)
RDW: 15.8 % — ABNORMAL HIGH (ref 11.5–15.5)
WBC: 13.2 10*3/uL — ABNORMAL HIGH (ref 4.0–10.5)
nRBC: 0 % (ref 0.0–0.2)

## 2022-05-18 LAB — RPR: RPR Ser Ql: NONREACTIVE

## 2022-05-18 LAB — GLUCOSE, CAPILLARY: Glucose-Capillary: 73 mg/dL (ref 70–99)

## 2022-05-18 SURGERY — Surgical Case
Anesthesia: Spinal

## 2022-05-18 MED ORDER — IBUPROFEN 600 MG PO TABS
600.0000 mg | ORAL_TABLET | Freq: Four times a day (QID) | ORAL | Status: DC
  Administered 2022-05-19 – 2022-05-20 (×5): 600 mg via ORAL
  Filled 2022-05-18 (×4): qty 1

## 2022-05-18 MED ORDER — DIPHENHYDRAMINE HCL 50 MG/ML IJ SOLN: 12.5000 mg | INTRAMUSCULAR | Status: DC | PRN

## 2022-05-18 MED ORDER — LACTATED RINGERS IV SOLN: INTRAVENOUS | Status: DC

## 2022-05-18 MED ORDER — FENTANYL CITRATE (PF) 100 MCG/2ML IJ SOLN
INTRAMUSCULAR | Status: AC
  Filled 2022-05-18: qty 2

## 2022-05-18 MED ORDER — PHENYLEPHRINE HCL (PRESSORS) 10 MG/ML IV SOLN
INTRAVENOUS | Status: DC | PRN
  Administered 2022-05-18: 80 ug via INTRAVENOUS
  Administered 2022-05-18: 160 ug via INTRAVENOUS
  Administered 2022-05-18 (×3): 80 ug via INTRAVENOUS

## 2022-05-18 MED ORDER — ACETAMINOPHEN 500 MG PO TABS
1000.0000 mg | ORAL_TABLET | Freq: Four times a day (QID) | ORAL | Status: DC
  Administered 2022-05-18 – 2022-05-20 (×7): 1000 mg via ORAL
  Filled 2022-05-18 (×7): qty 2

## 2022-05-18 MED ORDER — PRENATAL MULTIVITAMIN CH
1.0000 | ORAL_TABLET | Freq: Every day | ORAL | Status: DC
  Administered 2022-05-19 – 2022-05-20 (×2): 1 via ORAL
  Filled 2022-05-18: qty 1

## 2022-05-18 MED ORDER — OXYTOCIN-SODIUM CHLORIDE 30-0.9 UT/500ML-% IV SOLN
INTRAVENOUS | Status: AC
  Filled 2022-05-18: qty 500

## 2022-05-18 MED ORDER — ONDANSETRON HCL 4 MG/2ML IJ SOLN
INTRAMUSCULAR | Status: AC
  Filled 2022-05-18: qty 2

## 2022-05-18 MED ORDER — PHENYLEPHRINE HCL (PRESSORS) 10 MG/ML IV SOLN: INTRAVENOUS | Status: DC | PRN

## 2022-05-18 MED ORDER — SENNOSIDES-DOCUSATE SODIUM 8.6-50 MG PO TABS
2.0000 | ORAL_TABLET | ORAL | Status: DC
  Administered 2022-05-19: 2 via ORAL
  Filled 2022-05-18: qty 2

## 2022-05-18 MED ORDER — DEXAMETHASONE SODIUM PHOSPHATE 4 MG/ML IJ SOLN
INTRAMUSCULAR | Status: AC
  Filled 2022-05-18: qty 2

## 2022-05-18 MED ORDER — COCONUT OIL OIL: 1.0000 | TOPICAL_OIL | Status: DC | PRN

## 2022-05-18 MED ORDER — KETOROLAC TROMETHAMINE 30 MG/ML IJ SOLN
30.0000 mg | Freq: Four times a day (QID) | INTRAMUSCULAR | Status: AC
  Administered 2022-05-18 – 2022-05-19 (×3): 30 mg via INTRAVENOUS
  Filled 2022-05-18 (×3): qty 1

## 2022-05-18 MED ORDER — SCOPOLAMINE 1 MG/3DAYS TD PT72
MEDICATED_PATCH | TRANSDERMAL | Status: AC
  Filled 2022-05-18: qty 1

## 2022-05-18 MED ORDER — ESCITALOPRAM OXALATE 10 MG PO TABS
20.0000 mg | ORAL_TABLET | Freq: Every morning | ORAL | Status: DC
  Administered 2022-05-19 – 2022-05-20 (×2): 20 mg via ORAL
  Filled 2022-05-18 (×2): qty 2

## 2022-05-18 MED ORDER — DIPHENHYDRAMINE HCL 25 MG PO CAPS: 25.0000 mg | ORAL_CAPSULE | Freq: Four times a day (QID) | ORAL | Status: DC | PRN

## 2022-05-18 MED ORDER — DEXAMETHASONE SODIUM PHOSPHATE 10 MG/ML IJ SOLN
INTRAMUSCULAR | Status: DC | PRN
  Administered 2022-05-18: 8 mg via INTRAVENOUS

## 2022-05-18 MED ORDER — MORPHINE SULFATE (PF) 0.5 MG/ML IJ SOLN
INTRAMUSCULAR | Status: AC
  Filled 2022-05-18: qty 10

## 2022-05-18 MED ORDER — KETOROLAC TROMETHAMINE 30 MG/ML IJ SOLN
30.0000 mg | Freq: Four times a day (QID) | INTRAMUSCULAR | Status: DC | PRN
  Administered 2022-05-18: 30 mg via INTRAVENOUS

## 2022-05-18 MED ORDER — OXYCODONE HCL 5 MG PO TABS
5.0000 mg | ORAL_TABLET | ORAL | Status: DC | PRN
  Administered 2022-05-18 – 2022-05-20 (×6): 5 mg via ORAL
  Administered 2022-05-20: 10 mg via ORAL
  Filled 2022-05-18 (×3): qty 1
  Filled 2022-05-18: qty 2
  Filled 2022-05-18 (×3): qty 1

## 2022-05-18 MED ORDER — SIMETHICONE 80 MG PO CHEW: 80.0000 mg | CHEWABLE_TABLET | ORAL | Status: DC | PRN

## 2022-05-18 MED ORDER — ONDANSETRON HCL 4 MG/2ML IJ SOLN
INTRAMUSCULAR | Status: DC | PRN
  Administered 2022-05-18: 4 mg via INTRAVENOUS

## 2022-05-18 MED ORDER — MORPHINE SULFATE (PF) 0.5 MG/ML IJ SOLN
INTRAMUSCULAR | Status: DC | PRN
  Administered 2022-05-18: 150 ug via INTRATHECAL

## 2022-05-18 MED ORDER — CEFAZOLIN SODIUM-DEXTROSE 2-4 GM/100ML-% IV SOLN
2.0000 g | INTRAVENOUS | Status: AC
  Administered 2022-05-18: 2 g via INTRAVENOUS

## 2022-05-18 MED ORDER — CEFAZOLIN SODIUM-DEXTROSE 2-4 GM/100ML-% IV SOLN
INTRAVENOUS | Status: AC
  Filled 2022-05-18: qty 100

## 2022-05-18 MED ORDER — BUPROPION HCL ER (XL) 150 MG PO TB24
150.0000 mg | ORAL_TABLET | Freq: Every morning | ORAL | Status: DC
  Administered 2022-05-19 – 2022-05-20 (×2): 150 mg via ORAL
  Filled 2022-05-18 (×3): qty 1

## 2022-05-18 MED ORDER — ONDANSETRON HCL 4 MG/2ML IJ SOLN: 4.0000 mg | Freq: Three times a day (TID) | INTRAMUSCULAR | Status: DC | PRN

## 2022-05-18 MED ORDER — PHENYLEPHRINE HCL-NACL 20-0.9 MG/250ML-% IV SOLN
INTRAVENOUS | Status: DC | PRN
  Administered 2022-05-18: 60 ug/min via INTRAVENOUS

## 2022-05-18 MED ORDER — OXYTOCIN-SODIUM CHLORIDE 30-0.9 UT/500ML-% IV SOLN
2.5000 [IU]/h | INTRAVENOUS | Status: AC
  Administered 2022-05-18: 2.5 [IU]/h via INTRAVENOUS
  Filled 2022-05-18: qty 500

## 2022-05-18 MED ORDER — MEPERIDINE HCL 25 MG/ML IJ SOLN: 6.2500 mg | INTRAMUSCULAR | Status: DC | PRN

## 2022-05-18 MED ORDER — SOD CITRATE-CITRIC ACID 500-334 MG/5ML PO SOLN
ORAL | Status: AC
  Filled 2022-05-18: qty 30

## 2022-05-18 MED ORDER — SIMETHICONE 80 MG PO CHEW
80.0000 mg | CHEWABLE_TABLET | Freq: Three times a day (TID) | ORAL | Status: DC
  Administered 2022-05-18 – 2022-05-20 (×6): 80 mg via ORAL
  Filled 2022-05-18 (×6): qty 1

## 2022-05-18 MED ORDER — KETOROLAC TROMETHAMINE 30 MG/ML IJ SOLN
INTRAMUSCULAR | Status: AC
  Filled 2022-05-18: qty 1

## 2022-05-18 MED ORDER — SCOPOLAMINE 1 MG/3DAYS TD PT72
1.0000 | MEDICATED_PATCH | Freq: Once | TRANSDERMAL | Status: DC
  Administered 2022-05-18: 1.5 mg via TRANSDERMAL

## 2022-05-18 MED ORDER — ACETAMINOPHEN 10 MG/ML IV SOLN
INTRAVENOUS | Status: AC
  Filled 2022-05-18: qty 100

## 2022-05-18 MED ORDER — BUPIVACAINE IN DEXTROSE 0.75-8.25 % IT SOLN
INTRATHECAL | Status: DC | PRN
  Administered 2022-05-18: 1.7 mL via INTRATHECAL

## 2022-05-18 MED ORDER — FENTANYL CITRATE (PF) 100 MCG/2ML IJ SOLN
INTRAMUSCULAR | Status: DC | PRN
  Administered 2022-05-18: 15 ug via INTRATHECAL

## 2022-05-18 MED ORDER — KETOROLAC TROMETHAMINE 30 MG/ML IJ SOLN: 30.0000 mg | Freq: Four times a day (QID) | INTRAMUSCULAR | Status: DC | PRN

## 2022-05-18 MED ORDER — DIBUCAINE (PERIANAL) 1 % EX OINT: 1.0000 | TOPICAL_OINTMENT | CUTANEOUS | Status: DC | PRN

## 2022-05-18 MED ORDER — OXYTOCIN-SODIUM CHLORIDE 30-0.9 UT/500ML-% IV SOLN
INTRAVENOUS | Status: DC | PRN
  Administered 2022-05-18: 300 mL via INTRAVENOUS

## 2022-05-18 MED ORDER — ACETAMINOPHEN 10 MG/ML IV SOLN
INTRAVENOUS | Status: DC | PRN
  Administered 2022-05-18: 1000 mg via INTRAVENOUS

## 2022-05-18 MED ORDER — NALOXONE HCL 0.4 MG/ML IJ SOLN: 0.4000 mg | INTRAMUSCULAR | Status: DC | PRN

## 2022-05-18 MED ORDER — SODIUM CHLORIDE 0.9% FLUSH: 3.0000 mL | INTRAVENOUS | Status: DC | PRN

## 2022-05-18 MED ORDER — WITCH HAZEL-GLYCERIN EX PADS: 1.0000 | MEDICATED_PAD | CUTANEOUS | Status: DC | PRN

## 2022-05-18 MED ORDER — METOCLOPRAMIDE HCL 5 MG/ML IJ SOLN
INTRAMUSCULAR | Status: DC | PRN
  Administered 2022-05-18: 10 mg via INTRAVENOUS

## 2022-05-18 MED ORDER — NALOXONE HCL 4 MG/10ML IJ SOLN: 1.0000 ug/kg/h | INTRAVENOUS | Status: DC | PRN

## 2022-05-18 MED ORDER — TRANEXAMIC ACID-NACL 1000-0.7 MG/100ML-% IV SOLN
INTRAVENOUS | Status: AC
  Filled 2022-05-18: qty 100

## 2022-05-18 MED ORDER — MENTHOL 3 MG MT LOZG: 1.0000 | LOZENGE | OROMUCOSAL | Status: DC | PRN

## 2022-05-18 MED ORDER — POVIDONE-IODINE 10 % EX SWAB
2.0000 | Freq: Once | CUTANEOUS | Status: AC
  Administered 2022-05-18: 2 via TOPICAL

## 2022-05-18 MED ORDER — DIPHENHYDRAMINE HCL 25 MG PO CAPS: 25.0000 mg | ORAL_CAPSULE | ORAL | Status: DC | PRN

## 2022-05-18 MED ORDER — PHENYLEPHRINE HCL-NACL 20-0.9 MG/250ML-% IV SOLN
INTRAVENOUS | Status: AC
  Filled 2022-05-18: qty 250

## 2022-05-18 SURGICAL SUPPLY — 38 items
APL PRP STRL LF DISP 70% ISPRP (MISCELLANEOUS) ×2
APL SKNCLS STERI-STRIP NONHPOA (GAUZE/BANDAGES/DRESSINGS) ×1
BENZOIN TINCTURE PRP APPL 2/3 (GAUZE/BANDAGES/DRESSINGS) ×2 IMPLANT
CHLORAPREP W/TINT 26 (MISCELLANEOUS) ×4 IMPLANT
CLAMP UMBILICAL CORD (MISCELLANEOUS) ×2 IMPLANT
CLOTH BEACON ORANGE TIMEOUT ST (SAFETY) ×2 IMPLANT
DRSG OPSITE POSTOP 4X10 (GAUZE/BANDAGES/DRESSINGS) ×2 IMPLANT
ELECT REM PT RETURN 9FT ADLT (ELECTROSURGICAL) ×1
ELECTRODE REM PT RTRN 9FT ADLT (ELECTROSURGICAL) ×2 IMPLANT
EXTRACTOR VACUUM KIWI (MISCELLANEOUS) IMPLANT
GAUZE SPONGE 4X4 12PLY STRL LF (GAUZE/BANDAGES/DRESSINGS) IMPLANT
GLOVE BIOGEL M STER SZ 6 (GLOVE) ×2 IMPLANT
GLOVE BIOGEL PI IND STRL 6 (GLOVE) ×2 IMPLANT
GLOVE BIOGEL PI IND STRL 7.0 (GLOVE) ×2 IMPLANT
GOWN STRL REUS W/TWL LRG LVL3 (GOWN DISPOSABLE) ×4 IMPLANT
KIT ABG SYR 3ML LUER SLIP (SYRINGE) IMPLANT
NDL HYPO 25X5/8 SAFETYGLIDE (NEEDLE) IMPLANT
NEEDLE HYPO 25X5/8 SAFETYGLIDE (NEEDLE) IMPLANT
NS IRRIG 1000ML POUR BTL (IV SOLUTION) ×2 IMPLANT
PACK C SECTION WH (CUSTOM PROCEDURE TRAY) ×2 IMPLANT
PAD ABD 7.5X8 STRL (GAUZE/BANDAGES/DRESSINGS) IMPLANT
PAD OB MATERNITY 4.3X12.25 (PERSONAL CARE ITEMS) ×2 IMPLANT
RTRCTR C-SECT PINK 25CM LRG (MISCELLANEOUS) ×2 IMPLANT
STRIP CLOSURE SKIN 1/2X4 (GAUZE/BANDAGES/DRESSINGS) ×2 IMPLANT
SUT MNCRL 0 VIOLET CTX 36 (SUTURE) ×4 IMPLANT
SUT MONOCRYL 0 CTX 36 (SUTURE) ×2
SUT PDS AB 0 CTX 36 PDP370T (SUTURE) IMPLANT
SUT PLAIN 0 NONE (SUTURE) IMPLANT
SUT PLAIN 2 0 (SUTURE) ×2
SUT PLAIN ABS 2-0 CT1 27XMFL (SUTURE) ×2 IMPLANT
SUT VIC AB 0 CTX 36 (SUTURE) ×2
SUT VIC AB 0 CTX36XBRD ANBCTRL (SUTURE) ×4 IMPLANT
SUT VIC AB 2-0 CT1 27 (SUTURE) ×1
SUT VIC AB 2-0 CT1 TAPERPNT 27 (SUTURE) ×2 IMPLANT
SUT VICRYL 4-0 PS2 18IN ABS (SUTURE) ×2 IMPLANT
TOWEL OR 17X24 6PK STRL BLUE (TOWEL DISPOSABLE) ×2 IMPLANT
TRAY FOLEY W/BAG SLVR 14FR LF (SET/KITS/TRAYS/PACK) ×2 IMPLANT
WATER STERILE IRR 1000ML POUR (IV SOLUTION) ×2 IMPLANT

## 2022-05-18 NOTE — H&P (Signed)
37 y.o. G2P1002 @ [redacted]w[redacted]d presents for repeat cesarean section.  Otherwise has good fetal movement and no bleeding.  Pregnancy complicated by: History of cesarean section with G1: for twins, malpresentation with discordant growth.  Desires repeat GDMA2 on metformin 1000 mg daily in AM with good control.  Most recent growth [place] at 36 weeks 55%  Past Medical History:  Diagnosis Date   Anxiety    Complication of anesthesia    told heart rate dropped with anesthesia as a child   Depression    Dysrhythmia    supine tachycardia has seen cardiologist.  Dr Philis Pique has notes   Headache(784.0)    Polycystic ovarian disease     Past Surgical History:  Procedure Laterality Date   CESAREAN SECTION MULTI-GESTATIONAL N/A 02/19/2020   Procedure: CESAREAN SECTION MULTI-GESTATIONAL;  Surgeon: [name], MD;  Location: [facility] LD ORS;  Service: Obstetrics;  Laterality: N/A;   TONSILLECTOMY      OB History  Gravida Para Term Preterm AB Living  2 1 1     2   SAB IAB Ectopic Multiple Live Births        1 2    # Outcome Date GA Lbr Len/2nd Weight Sex Delivery Anes PTL Lv  2 Current           1A Term      CS-LTranv   LIV  1B Term      CS-LTranv   LIV    Social History   Socioeconomic History   Marital status: Married    Spouse name: Not on file   Number of children: Not on file   Years of education: Not on file   Highest education level: Not on file  Occupational History   Occupation: therapist  Tobacco Use   Smoking status: Former    Years: 7.00    Types: Cigarettes   Smokeless tobacco: Never   Tobacco comments:    1 Pack a Week  Vaping Use   Vaping Use: Never used  Substance and Sexual Activity   Alcohol use: Yes    Alcohol/week: 0.0 standard drinks of alcohol    Comment: a beer or liquour about 1 x per month   Drug use: No   Sexual activity: Yes    Birth control/protection: None  Other Topics Concern   Not on file  Social History Narrative   Not on file   Social Determinants  of Health   Financial Resource Strain: Not on file  Food Insecurity: Not on file  Transportation Needs: Not on file  Physical Activity: Not on file  Stress: Not on file  Social Connections: Not on file  Intimate Partner Violence: Not on file   Bactrim [sulfamethoxazole-trimethoprim]    Prenatal Transfer Tool  Maternal Diabetes: Yes:  Diabetes Type:  Insulin/Medication controlled Genetic Screening: Normal Maternal Ultrasounds/Referrals: Normal Fetal Ultrasounds or other Referrals:  None Maternal Substance Abuse:  No Significant Maternal Medications:  Meds include: Other:  Metformin as above Significant Maternal Lab Results: Group B Strep negative  ABO, Rh: --/--/A POS (01/29 2130) Antibody: NEG (01/29 0903) Rubella: Immune (07/19 0000) RPR: Nonreactive (07/19 0000)  HBsAg: Negative (07/19 0000)  HIV: Non-reactive (07/19 0000)  GBS: Negative/-- (01/09 0000)    Vitals:   05/18/22 0549  BP: 137/82  Pulse: 92  Resp: 16  Temp: 98.5 F (36.9 C)  SpO2: 99%     General:  NAD Abdomen:  soft, gravid Ex:  trace edema FHTs:  133    A/P  37 y.o. G2P1002 [redacted]w[redacted]d presents for repeat cesarean section.    Elective repeat cesarean section at term. Discussed risks of cesarean section to include, but not limited to, infection, bleeding, damage to surrounding strutcures (including bowel, bladder, tubes, ovaries, nerves, vessels, baby), need for additional procedures such as hysterectomy, risk of blood clot, need for transfusion. Consent signed. Ancef 2gm on call to OR  GDMA2: BG 77 this AM.  Will check fasting BG postpartum, plan 2hr gtt at 6-12 weeks  Ancef 2gm on call to [place]

## 2022-05-18 NOTE — Anesthesia Postprocedure Evaluation (Signed)
Anesthesia Post Note  Patient: GETHSEMANE FISCHLER  Procedure(s) Performed: CESAREAN SECTION     Patient location during evaluation: PACU Anesthesia Type: Spinal Level of consciousness: oriented and awake and alert Pain management: pain level controlled Vital Signs Assessment: post-procedure vital signs reviewed and stable Respiratory status: spontaneous breathing, respiratory function stable and patient connected to nasal cannula oxygen Cardiovascular status: blood pressure returned to baseline and stable Postop Assessment: no headache, no backache and no apparent nausea or vomiting Anesthetic complications: no  No notable events documented.  Last Vitals:  Vitals:   05/18/22 1305 05/18/22 1410  BP: (!) 105/57 (!) 102/52  Pulse: 62 63  Resp: 16 16  Temp: 36.8 C 36.9 C  SpO2: 100% 97%    Last Pain:  Vitals:   05/18/22 1410  TempSrc: Oral  PainSc: 0-No pain   Pain Goal:                   Nelia Rogoff

## 2022-05-18 NOTE — Anesthesia Procedure Notes (Signed)
Spinal  Patient location during procedure: OR Start time: 05/18/2022 7:33 AM End time: 05/18/2022 7:38 AM Reason for block: surgical anesthesia Staffing Performed: resident/CRNA  Anesthesiologist: Janeece Riggers, MD Performed by: Janeece Riggers, MD Authorized by: Janeece Riggers, MD   Preanesthetic Checklist Completed: patient identified, IV checked, site marked, risks and benefits discussed, surgical consent, monitors and equipment checked, pre-op evaluation and timeout performed Spinal Block Patient position: sitting Prep: DuraPrep Patient monitoring: heart rate, cardiac monitor, continuous pulse ox and blood pressure Approach: midline Location: L3-4 Injection technique: single-shot Needle Needle type: Sprotte  Needle gauge: 24 G Needle length: 9 cm Assessment Sensory level: T4 Events: CSF return

## 2022-05-18 NOTE — Transfer of Care (Signed)
Immediate Anesthesia Transfer of Care Note  Patient: Brenda Mcdaniel  Procedure(s) Performed: CESAREAN SECTION  Patient Location: PACU  Anesthesia Type:Spinal  Level of Consciousness: awake, alert , and oriented  Airway & Oxygen Therapy: Patient Spontanous Breathing  Post-op Assessment: Report given to RN and Post -op Vital signs reviewed and stable  Post vital signs: Reviewed and stable  Last Vitals:  Vitals Value Taken Time  BP 122/54 05/18/22 0856  Temp    Pulse 66 05/18/22 0900  Resp 14 05/18/22 0900  SpO2 99 % 05/18/22 0900  Vitals shown include unvalidated device data.  Last Pain:  Vitals:   05/18/22 0556  PainSc: 0-No pain         Complications: No notable events documented.

## 2022-05-18 NOTE — Op Note (Signed)
Cesarean Section Procedure Note  Pre-operative Diagnosis: 1. Intrauterine pregnancy at [redacted]w[redacted]d  2. History of cesarean delivery, declines trial of labor  3.  Gestational diabetes, class A2  Post-operative Diagnosis: same as above  Surgeon: Jerelyn Charles, MD  Assistants: [name], MD  Procedure: Repeat low transverse cesarean section   Anesthesia: Spinal anesthesia  Estimated Blood Loss: 776 mL         Drains: Foley catheter         Specimens: placenta to L&D              Complications:  None; patient tolerated the procedure well.         Disposition: PACU - hemodynamically stable.  Findings:  Normal uterus, tubes and ovaries bilaterally.  True knot in cord.  Viable female infant, 3610g (7lb 15.3oz) Apgars 8, 9.    Procedure Details   After spinal  anesthesia was found to adequate, the patient was placed in the dorsal supine position with a leftward tilt, prepped and draped in the usual sterile manner. A Pfannenstiel incision was made and carried down through the subcutaneous tissue to the fascia.  The fascia was incised in the midline and the fascial incision was extended laterally with Mayo scissors. The superior aspect of the fascial incision was grasped with two Kocher clamps, tented up and the rectus muscles dissected off sharply. The rectus was then dissected off with blunt dissection and Mayo scissors inferiorly. The rectus muscles were separated in the midline. The abdominal peritoneum was identified, tented up, entered bluntly, and the incision was extended superiorly and inferiorly with good visualization of the bladder. The Alexis retractor was deployed. The bladder was low, so a bladder flap was not needed. A scalpel was then used to make a low transverse incision on the uterus which was extended in the cephalad-caudad direction with blunt dissection. The fluid was clear. The fetal vertex was identified, elevated out of the pelvis and brought to the hysterotomy.  The head  was delivered easily followed by the shoulders and body.  After a 60 second delay per protocol, the cord was clamped and cut and the infant was passed to the waiting neonatologist.  The placenta was then delivered spontaneously, intact and appear normal, the uterus was cleared of all clot and debris   The hysterotomy was repaired with #0 Monocryl in running locked fashion.  Excellent hemostasis was noted.  The Alexis retractor was removed from the abdomen. The peritoneum was examined and all vessels noted to be hemostatic. The abdominal cavity was cleared of all clot and debris.  The peritoneum was closed with 2-0 vicryl in a running fashion.  The rectus muscles were then closed with 2-0 Vicryl. The fascia and rectus muscles were inspected and were hemostatic. The fascia was closed with 0 PDS in a running fashion. The subcutaneous layer was irrigated and all bleeders cauterized. The subcutaneous layer was closed with interrupted plain gut. The skin was closed with 4-0 vicryl in a subcuticular fashion. The incision was dressed with benzoine, steri strips and honeycomb dressing. All sponge lap and needle counts were correct x3. Patient tolerated the procedure well and recovered in stable condition following the procedure.

## 2022-05-19 LAB — CBC
HCT: 30.6 % — ABNORMAL LOW (ref 36.0–46.0)
Hemoglobin: 9.9 g/dL — ABNORMAL LOW (ref 12.0–15.0)
MCH: 25.8 pg — ABNORMAL LOW (ref 26.0–34.0)
MCHC: 32.4 g/dL (ref 30.0–36.0)
MCV: 79.9 fL — ABNORMAL LOW (ref 80.0–100.0)
Platelets: 227 10*3/uL (ref 150–400)
RBC: 3.83 MIL/uL — ABNORMAL LOW (ref 3.87–5.11)
RDW: 15.8 % — ABNORMAL HIGH (ref 11.5–15.5)
WBC: 13.1 10*3/uL — ABNORMAL HIGH (ref 4.0–10.5)
nRBC: 0 % (ref 0.0–0.2)

## 2022-05-19 LAB — GLUCOSE, CAPILLARY: Glucose-Capillary: 79 mg/dL (ref 70–99)

## 2022-05-19 MED ORDER — FERROUS SULFATE 325 (65 FE) MG PO TABS
325.0000 mg | ORAL_TABLET | Freq: Every day | ORAL | Status: DC
  Administered 2022-05-20: 325 mg via ORAL
  Filled 2022-05-19: qty 1

## 2022-05-19 NOTE — Social Work (Signed)
CSW received consult for hx of Anxiety and EPDS score of 11.  CSW met with [name] to offer support and complete assessment. CSW entered there room, introduced self, CSW role and reason for visit, [name] was agreeable to visit. CSW inquired about how [name] was feeling, [name] reported good. CSW inquired about [name] MH hx, [name] reported she was diagnosed with Anxiety 20 years ago [name] reported her symptoms are well controlled with Lexapro and Wellbutrin.  CSW inquired about [name] mood over the past 7 days, [name] reported she was emotional and not ready for I'm to come out. [name] reported crying at home but feeling better now. CSW assessed for safety, [name] denied any SI or HI. [name] identified her spouse and mom as her supports. CSW provided education regarding the baby blues period vs. perinatal mood disorders, discussed treatment and gave resources for mental health follow up if concerns arise.  CSW recommends self-evaluation during the postpartum time period using the New Mom Checklist from Postpartum Progress and encouraged [name] to contact a medical professional if symptoms are noted at any time.    CSW provided review of Sudden Infant Death Syndrome (SIDS) precautions.  [name] reported they have all necessary ites for the infant including a bassinet for him to sleep.  CSW identifies no further need for intervention and no barriers to discharge at this time.  Brenda Mcdaniel, LCSWA Clinical Social Worker [phone] 

## 2022-05-19 NOTE — Progress Notes (Addendum)
POSTPARTUM POSTOP PROGRESS NOTE  POD #1  Subjective:  No acute events overnight.  Pt denies problems with ambulating, voiding or po intake.  She denies nausea or vomiting.  Pain is well controlled.  She has had flatus. She has not had bowel movement.  Lochia Minimal. Desires circ today  Objective: Blood pressure 107/66, pulse 60, temperature 98.5 F (36.9 C), temperature source Oral, resp. rate 18, height 5\' 9"  (1.753 m), weight 116.4 kg, SpO2 100 %, unknown if currently breastfeeding.  Physical Exam:  General: alert, cooperative and no distress Lochia:normal flow Chest: CTAB Heart: RRR no m/r/g Abdomen: +BS, soft, nontender Uterine Fundus: firm, 1cm below umbilicus. Pressure dressing removed and CDI, old honeycomb with old blood along inferior edge. To be replaced Extremities: trace edema, neg calf TTP BL, neg Homans BL  Recent Labs    05/18/22 1904 05/19/22 0430  HGB 10.5* 9.9*  HCT 32.8* 30.6*    Assessment/Plan:  ASSESSMENT: Brenda Mcdaniel is a 37 y.o. G2P2003 s/p ERLTCS @ [redacted]w[redacted]d for h/o csx x1. PNC c/b GDMA2 on metformin 1000mg  QD.   Plan for discharge tomorrow and Circumcision prior to discharge [place] - AM BG Wnl at 79. Plan for 2hr GTT postpartum Anemia 2/2 acute EBL - asx, PO iron started   LOS: 1 day

## 2022-05-20 MED ORDER — OXYCODONE HCL 5 MG PO TABS: 5.0000 mg | ORAL_TABLET | ORAL | 0 refills | Status: DC | PRN

## 2022-05-20 NOTE — Discharge Summary (Signed)
Postpartum Discharge Summary     Patient Name: Brenda Mcdaniel DOB: [birth year] MRN: [id number]  Date of admission: 05/18/2022 Delivery date:05/18/2022  Delivering provider: Jerelyn Charles  Date of discharge: 05/20/2022  Admitting diagnosis: History of cesarean delivery [Z98.891] Intrauterine pregnancy: [redacted]w[redacted]d     Secondary diagnosis:  Principal Problem:   History of cesarean delivery  Additional problems: GMDA2    Discharge diagnosis: Term Pregnancy Delivered and Anemia                                              Post partum procedures: none Augmentation: N/A Complications: None  Hospital course: Sceduled C/S   37 y.o. yo G2P2003 at [redacted]w[redacted]d was admitted to the hospital 05/18/2022 for scheduled cesarean section with the following indication:Elective Repeat.Delivery details are as follows:  Membrane Rupture Time/Date: 7:59 AM ,05/18/2022   Delivery Method:C-Section, Low Transverse  Details of operation can be found in separate operative note.  Patient had a postpartum course complicated bymild acute blood loss anemia.  She is ambulating, tolerating a regular diet, passing flatus, and urinating well. Patient is discharged home in stable condition on  05/20/22        Newborn Data: Birth date:05/18/2022  Birth time:8:00 AM  Gender:Female  Living status:Living  Apgars:8 ,9  Weight:3610 g     Magnesium Sulfate received: No BMZ received: No Rhophylac:N/A Transfusion:No  Physical exam  Vitals:   05/19/22 0525 05/19/22 1459 05/19/22 2038 05/20/22 0602  BP: 107/66 121/66 120/61 94/60  Pulse: 60 73 88 73  Resp: 18 18 18 18   Temp: 98.5 F (36.9 C) 98.2 F (36.8 C) 97.7 F (36.5 C) 97.7 F (36.5 C)  TempSrc: Oral Oral Oral Oral  SpO2: 100%  98% 99%  Weight:      Height:       General: alert, cooperative, and no distress Lochia: appropriate Uterine Fundus: firm Incision: Healing well with no significant drainage, No significant erythema, Dressing is clean, dry, and intact DVT  Evaluation: No evidence of DVT seen on physical exam. Labs: Lab Results  Component Value Date   WBC 13.1 (H) 05/19/2022   HGB 9.9 (L) 05/19/2022   HCT 30.6 (L) 05/19/2022   MCV 79.9 (L) 05/19/2022   PLT 227 05/19/2022       No data to display         Edinburgh Score:    05/18/2022    8:26 PM  Edinburgh Postnatal Depression Scale Screening Tool  I have been able to laugh and see the funny side of things. 1  I have looked forward with enjoyment to things. 1  I have blamed myself unnecessarily when things went wrong. 1  I have been anxious or worried for no good reason. 2  I have felt scared or panicky for no good reason. 1  Things have been getting on top of me. 1  I have been so unhappy that I have had difficulty sleeping. 1  I have felt sad or miserable. 1  I have been so unhappy that I have been crying. 2  The thought of harming myself has occurred to me. 0  Edinburgh Postnatal Depression Scale Total 11      After visit meds:  Allergies as of 05/20/2022       Reactions   Bactrim [sulfamethoxazole-trimethoprim] Itching   Yeast infection  Medication List     STOP taking these medications    acetaminophen 500 MG tablet Commonly known as: TYLENOL   folic acid 1 MG tablet Commonly known as: FOLVITE   metFORMIN 500 MG 24 hr tablet Commonly known as: GLUCOPHAGE-XR       TAKE these medications    buPROPion 150 MG 24 hr tablet Commonly known as: WELLBUTRIN XL Take 150 mg by mouth in the morning.   escitalopram 20 MG tablet Commonly known as: LEXAPRO Take 20 mg by mouth in the morning.   esomeprazole 20 MG capsule Commonly known as: NEXIUM Take 20 mg by mouth daily before breakfast.   IRON PO Take 1 tablet by mouth in the morning.   oxyCODONE 5 MG immediate release tablet Commonly known as: Oxy IR/ROXICODONE Take 1-2 tablets (5-10 mg total) by mouth every 4 (four) hours as needed for moderate pain.   Pepcid Complete 10-800-165 MG chewable  tablet Generic drug: famotidine-calcium carbonate-magnesium hydroxide Chew 1 tablet by mouth in the morning.   prenatal multivitamin Tabs tablet Take 1 tablet by mouth in the morning. Plus DHA               Discharge Care Instructions  (From admission, onward)           Start     Ordered   05/20/22 0000  Discharge wound care:       Comments: Can remove dressing 5 days from date of surgery   05/20/22 1208             Discharge home in stable condition  Infant Disposition:home with mother Discharge instruction: per After Visit Summary and Postpartum booklet. Activity: Advance as tolerated. Pelvic rest for 6 weeks.  Diet: routine diet Anticipated Birth Control: Unsure Postpartum Appointment:2 weeks Additional Postpartum F/U: Postpartum Depression checkup and Incision check as above Future Appointments:No future appointments. Follow up Visit:  Follow-up Information     Jerelyn Charles, MD. Call.   Specialty: Obstetrics Why: Call office to schedule incision check in 2 weeks and postpartum visit in 4-6 weeks Contact information: [address] [place] [address] [phone]                     05/20/2022 Allyn Kenner, DO

## 2022-05-29 ENCOUNTER — Telehealth (HOSPITAL_COMMUNITY): Payer: Self-pay

## 2022-05-29 NOTE — Telephone Encounter (Signed)
Patient did not answer phone call. Voicemail left for patient.   Sharyn Lull [facility] 05/29/22,1353

## 2023-07-11 LAB — OB RESULTS CONSOLE GC/CHLAMYDIA
Chlamydia: NEGATIVE
Neisseria Gonorrhea: NEGATIVE

## 2023-07-11 LAB — OB RESULTS CONSOLE RPR: RPR: NONREACTIVE

## 2023-07-11 LAB — OB RESULTS CONSOLE ANTIBODY SCREEN: Antibody Screen: NEGATIVE

## 2023-07-11 LAB — HEPATITIS C ANTIBODY: HCV Ab: NEGATIVE

## 2023-07-11 LAB — OB RESULTS CONSOLE HEPATITIS B SURFACE ANTIGEN: Hepatitis B Surface Ag: NEGATIVE

## 2023-07-11 LAB — OB RESULTS CONSOLE RUBELLA ANTIBODY, IGM: Rubella: IMMUNE

## 2023-07-11 LAB — OB RESULTS CONSOLE HIV ANTIBODY (ROUTINE TESTING): HIV: NONREACTIVE

## 2023-11-04 ENCOUNTER — Inpatient Hospital Stay (HOSPITAL_COMMUNITY)

## 2023-11-04 ENCOUNTER — Inpatient Hospital Stay (HOSPITAL_COMMUNITY)
Admission: AD | Admit: 2023-11-04 | Discharge: 2023-11-04 | Disposition: A | Discharge status: Home / Self Care | Attending: Obstetrics | Admitting: Obstetrics

## 2023-11-04 ENCOUNTER — Encounter (HOSPITAL_COMMUNITY): Payer: Self-pay | Admitting: *Deleted

## 2023-11-04 DIAGNOSIS — O09522 Supervision of elderly multigravida, second trimester: Secondary | ICD-10-CM | POA: Insufficient documentation

## 2023-11-04 DIAGNOSIS — Z3A27 27 weeks gestation of pregnancy: Secondary | ICD-10-CM

## 2023-11-04 DIAGNOSIS — R0602 Shortness of breath: Secondary | ICD-10-CM | POA: Diagnosis not present

## 2023-11-04 DIAGNOSIS — Z713 Dietary counseling and surveillance: Secondary | ICD-10-CM | POA: Diagnosis not present

## 2023-11-04 DIAGNOSIS — Z3492 Encounter for supervision of normal pregnancy, unspecified, second trimester: Secondary | ICD-10-CM

## 2023-11-04 DIAGNOSIS — O99891 Other specified diseases and conditions complicating pregnancy: Secondary | ICD-10-CM | POA: Diagnosis present

## 2023-11-04 DIAGNOSIS — O26892 Other specified pregnancy related conditions, second trimester: Secondary | ICD-10-CM | POA: Diagnosis not present

## 2023-11-04 LAB — CBC WITH DIFFERENTIAL/PLATELET
Abs Immature Granulocytes: 0.07 K/uL (ref 0.00–0.07)
Basophils Absolute: 0 K/uL (ref 0.0–0.1)
Basophils Relative: 0 %
Eosinophils Absolute: 0.2 K/uL (ref 0.0–0.5)
Eosinophils Relative: 1 %
HCT: 35.6 % — ABNORMAL LOW (ref 36.0–46.0)
Hemoglobin: 11.7 g/dL — ABNORMAL LOW (ref 12.0–15.0)
Immature Granulocytes: 1 %
Lymphocytes Relative: 20 %
Lymphs Abs: 2.2 K/uL (ref 0.7–4.0)
MCH: 26.7 pg (ref 26.0–34.0)
MCHC: 32.9 g/dL (ref 30.0–36.0)
MCV: 81.3 fL (ref 80.0–100.0)
Monocytes Absolute: 1.1 K/uL — ABNORMAL HIGH (ref 0.1–1.0)
Monocytes Relative: 10 %
Neutro Abs: 7.5 K/uL (ref 1.7–7.7)
Neutrophils Relative %: 68 %
Platelets: 260 K/uL (ref 150–400)
RBC: 4.38 MIL/uL (ref 3.87–5.11)
RDW: 14.8 % (ref 11.5–15.5)
WBC: 11 K/uL — ABNORMAL HIGH (ref 4.0–10.5)
nRBC: 0 % (ref 0.0–0.2)

## 2023-11-04 NOTE — MAU Provider Note (Signed)
 Chief Complaint:  Shortness of Breath and Tachycardia   HPI   None     Brenda Mcdaniel is a 38 y.o. G3P2003 at [redacted]w[redacted]d who presents to maternity admissions reporting SOB which she notes started worsening SOB this morning. She reports increased heart rate with activity. She is concerned about her iron level, as this was a concern in a previous pregnancy, and her symptoms are similar. She reports she has not been drinking as well as she could. .   Pregnancy Course: [facility]  Past Medical History:  Diagnosis Date   Anxiety    Complication of anesthesia    told heart rate dropped with anesthesia as a child   Depression    Dysrhythmia    supine tachycardia has seen cardiologist.  Dr Sarrah has notes   Headache(784.0)    Polycystic ovarian disease    OB History  Gravida Para Term Preterm AB Living  3 2 2   3   SAB IAB Ectopic Multiple Live Births     1 3    # Outcome Date GA Lbr Len/2nd Weight Sex Type Anes PTL Lv  3 Current           2 Term 05/18/22 [redacted]w[redacted]d  3610 g M CS-LTranv Spinal  LIV  1A Term      CS-LTranv   LIV  1B Term      CS-LTranv   LIV   Past Surgical History:  Procedure Laterality Date   CESAREAN SECTION N/A 05/18/2022   Procedure: CESAREAN SECTION;  Surgeon: Gretta Gums, MD;  Location: [facility] LD ORS;  Service: Obstetrics;  Laterality: N/A;   CESAREAN SECTION MULTI-GESTATIONAL N/A 02/19/2020   Procedure: CESAREAN SECTION MULTI-GESTATIONAL;  Surgeon: Sarrah Browning, MD;  Location: [facility] LD ORS;  Service: Obstetrics;  Laterality: N/A;   TONSILLECTOMY     Family History  Problem Relation Age of Onset   Depression Mother    Alcohol abuse Father    Schizophrenia Father    Depression Paternal Aunt    Depression Paternal Uncle    Depression Maternal Aunt    Schizophrenia Maternal Grandfather    Diabetes Maternal Grandfather    Social History   Tobacco Use   Smoking status: Former    Types: Cigarettes   Smokeless tobacco: Never   Tobacco comments:    1 Pack a Week   Vaping Use   Vaping status: Never Used  Substance Use Topics   Alcohol use: Yes    Alcohol/week: 0.0 standard drinks of alcohol    Comment: a beer or liquour about 1 x per month   Drug use: No   Allergies  Allergen Reactions   Bactrim [Sulfamethoxazole-Trimethoprim] Itching    Yeast infection   Medications Prior to Admission  Medication Sig Dispense Refill Last Dose/Taking   buPROPion  (WELLBUTRIN  XL) 150 MG 24 hr tablet Take 150 mg by mouth in the morning.   11/04/2023   escitalopram  (LEXAPRO ) 20 MG tablet Take 20 mg by mouth in the morning.   11/04/2023   famotidine-calcium carbonate-magnesium hydroxide (PEPCID COMPLETE) 10-800-165 MG chewable tablet Chew 1 tablet by mouth in the morning.   11/04/2023   Ferrous Sulfate  (IRON PO) Take 1 tablet by mouth in the morning.   11/04/2023   Prenatal Vit-Fe Fumarate-FA (PRENATAL MULTIVITAMIN) TABS tablet Take 1 tablet by mouth in the morning. Plus DHA   11/04/2023   esomeprazole (NEXIUM) 20 MG capsule Take 20 mg by mouth daily before breakfast.      oxyCODONE  (OXY  IR/ROXICODONE ) 5 MG immediate release tablet Take 1-2 tablets (5-10 mg total) by mouth every 4 (four) hours as needed for moderate pain. 30 tablet 0     I have reviewed patient's Past Medical Hx, Surgical Hx, Family Hx, Social Hx, medications and allergies.   ROS  Pertinent items noted in HPI and remainder of comprehensive ROS otherwise negative.   PHYSICAL EXAM  Patient Vitals for the past 24 hrs:  BP Temp Temp src Pulse Resp SpO2 Height Weight  11/04/23 1611 111/72 98.7 F (37.1 C) Oral 92 18 99 % 5' 8 (1.727 m) 107.9 kg  11/04/23 1610 -- -- -- -- -- 100 % -- --    Physical Exam Vitals and nursing note reviewed.  Constitutional:      Appearance: Normal appearance.  HENT:     Head: Normocephalic.  Cardiovascular:     Rate and Rhythm: Normal rate.     Pulses: Normal pulses.  Pulmonary:     Effort: Pulmonary effort is normal. No tachypnea, bradypnea, accessory muscle  usage or respiratory distress.     Breath sounds: Normal breath sounds. No stridor.  Skin:    General: Skin is warm and dry.     Capillary Refill: Capillary refill takes less than 2 seconds.  Neurological:     General: No focal deficit present.     Mental Status: She is alert and oriented to person, place, and time.  Psychiatric:        Mood and Affect: Mood normal.        Behavior: Behavior normal.        Thought Content: Thought content normal.        Judgment: Judgment normal.         Fetal Tracing: Baseline: 145 Variability: moderate Accelerations: 10x10 Decelerations: variable Toco: quiet   Labs: Results for orders placed or performed during the hospital encounter of 11/04/23 (from the past 24 hours)  CBC with Differential/Platelet     Status: Abnormal   Collection Time: 11/04/23  5:11 PM  Result Value Ref Range   WBC 11.0 (H) 4.0 - 10.5 K/uL   RBC 4.38 3.87 - 5.11 MIL/uL   Hemoglobin 11.7 (L) 12.0 - 15.0 g/dL   HCT 64.3 (L) 63.9 - 53.9 %   MCV 81.3 80.0 - 100.0 fL   MCH 26.7 26.0 - 34.0 pg   MCHC 32.9 30.0 - 36.0 g/dL   RDW 85.1 88.4 - 84.4 %   Platelets 260 150 - 400 K/uL   nRBC 0.0 0.0 - 0.2 %   Neutrophils Relative % 68 %   Neutro Abs 7.5 1.7 - 7.7 K/uL   Lymphocytes Relative 20 %   Lymphs Abs 2.2 0.7 - 4.0 K/uL   Monocytes Relative 10 %   Monocytes Absolute 1.1 (H) 0.1 - 1.0 K/uL   Eosinophils Relative 1 %   Eosinophils Absolute 0.2 0.0 - 0.5 K/uL   Basophils Relative 0 %   Basophils Absolute 0.0 0.0 - 0.1 K/uL   Immature Granulocytes 1 %   Abs Immature Granulocytes 0.07 0.00 - 0.07 K/uL    Imaging:  DG CHEST PORT 1 VIEW Result Date: 11/04/2023 CLINICAL DATA:  [id number] SOB (shortness of breath) [id number] EXAM: PORTABLE CHEST - 1 VIEW COMPARISON:  None available. FINDINGS: No focal airspace consolidation, pleural effusion, or pneumothorax. No cardiomegaly. No acute fracture or destructive lesion. IMPRESSION: No acute cardiopulmonary abnormality.  Electronically Signed   By: Rogelia Myers M.D.   On: 11/04/2023 18:08  MDM & MAU COURSE  MDM: Moderate Imaging done to rule out cardiac or pulmonary concern. No abnormalities seen.  Ddx includes physiologic dyspnea of pregnancy vs. Anemia vs. Asthma exacerbation vs. PE vs. Cardiac concern VSS  MAU Course: Orders Placed This Encounter  Procedures   DG CHEST PORT 1 VIEW   CBC with Differential/Platelet   ED EKG   Discharge patient   No orders of the defined types were placed in this encounter.   ASSESSMENT   1. Shortness of breath due to pregnancy in second trimester   2. Movement of fetus present during pregnancy in second trimester     PLAN  Discharge home in stable condition.  Return to MAU if symptoms persist or worsen.   Dietary and hydration counseling provided.  Keep all upcoming prenatal care.     Allergies as of 11/04/2023       Reactions   Bactrim [sulfamethoxazole-trimethoprim] Itching   Yeast infection        Medication List     STOP taking these medications    esomeprazole 20 MG capsule Commonly known as: NEXIUM   oxyCODONE  5 MG immediate release tablet Commonly known as: Oxy IR/ROXICODONE        TAKE these medications    buPROPion  150 MG 24 hr tablet Commonly known as: WELLBUTRIN  XL Take 150 mg by mouth in the morning.   escitalopram  20 MG tablet Commonly known as: LEXAPRO  Take 20 mg by mouth in the morning.   IRON PO Take 1 tablet by mouth in the morning.   Pepcid Complete 10-800-165 MG chewable tablet Generic drug: famotidine-calcium carbonate-magnesium hydroxide Chew 1 tablet by mouth in the morning.   prenatal multivitamin Tabs tablet Take 1 tablet by mouth in the morning. Plus DHA        [name] Rote, MSN, CNM 11/04/2023 7:02 PM  Certified Nurse Midwife, [facility]

## 2023-11-04 NOTE — MAU Note (Signed)
 Brenda Mcdaniel is a 38 y.o. at [redacted]w[redacted]d here in MAU reporting: called the nurse line, was having SOB when she woke up this morning.   Her heart rate would shoot up with any activity.  She thinks it might be her iron level. Pt had issue when pregnant with twins, had work up, reason was due to babies and how they were positioned, taking up room.  States is still feeling SOB. No bleeding, no LOF, no pain.  Reports +FM Onset of complaint: this morning Pain score: none Vitals:   11/04/23 1610 11/04/23 1611  BP:  111/72  Pulse:  92  Resp:  18  Temp:  98.7 F (37.1 C)  SpO2: 100% 99%     FHT:157 Lab orders placed from triage:  urine obtained

## 2023-11-04 NOTE — Discharge Instructions (Signed)
 Brenda Mcdaniel,  It was a pleasure meeting you in MAU, your labs and imaging were all normal. I recommend hydration and considering electrolytes such as Gatorade. You may also take a magnesium supplement (I like magnesium glycinate or oxide).  Please come back to MAU with worsening symptoms or other concerns.  Thank you for trusting us  to care for you, Brenda Mcdaniel, Midwife

## 2023-12-09 ENCOUNTER — Encounter: Payer: Self-pay | Admitting: *Deleted

## 2024-01-05 ENCOUNTER — Telehealth (HOSPITAL_COMMUNITY): Payer: Self-pay | Admitting: *Deleted

## 2024-01-05 ENCOUNTER — Encounter (HOSPITAL_COMMUNITY): Payer: Self-pay

## 2024-01-05 NOTE — Patient Instructions (Signed)
 Brenda Mcdaniel  01/05/2024   Your procedure is scheduled on:  01/19/2024  Arrive at 0530 at Entrance C on [address] at [facility]  and [employer]. You are invited to use the FREE valet parking or use the Visitor's parking deck.  Pick up the phone at the desk and dial [phone].  Call this number if you have problems the morning of surgery: [phone]  Remember:   Do not eat food:(After Midnight) Desps de medianoche.    Take these medicines the morning of surgery with A SIP OF WATER:  Take wellbutrin  and lexapro  as prescribed.  May take famotidine if desired   Do not wear jewelry, make-up or nail polish.  Do not wear lotions, powders, or perfumes. Do not wear deodorant.  Do not shave 48 hours prior to surgery.  Do not bring valuables to the hospital.  [facility] is not   responsible for any belongings or valuables brought to the hospital.  Contacts, dentures or bridgework may not be worn into surgery.  Leave suitcase in the car. After surgery it may be brought to your room.  For patients admitted to the hospital, checkout time is 11:00 AM the day of              discharge.      Please read over the following fact sheets that you were given:     Preparing for Surgery

## 2024-01-05 NOTE — Telephone Encounter (Signed)
 Preadmission screen

## 2024-01-06 ENCOUNTER — Encounter (HOSPITAL_COMMUNITY): Payer: Self-pay

## 2024-01-17 ENCOUNTER — Encounter (HOSPITAL_COMMUNITY)
Admission: RE | Admit: 2024-01-17 | Discharge: 2024-01-17 | Disposition: A | Discharge status: Home / Self Care | Source: Ambulatory Visit | Attending: Obstetrics and Gynecology | Admitting: Obstetrics and Gynecology

## 2024-01-17 VITALS — Ht 69.0 in | Wt 247.0 lb

## 2024-01-17 DIAGNOSIS — Z98891 History of uterine scar from previous surgery: Secondary | ICD-10-CM | POA: Insufficient documentation

## 2024-01-17 DIAGNOSIS — Z01812 Encounter for preprocedural laboratory examination: Secondary | ICD-10-CM | POA: Insufficient documentation

## 2024-01-17 HISTORY — DX: Gestational diabetes mellitus in pregnancy, unspecified control: O24.419

## 2024-01-17 LAB — CBC
HCT: 36.8 % (ref 36.0–46.0)
Hemoglobin: 11.4 g/dL — ABNORMAL LOW (ref 12.0–15.0)
MCH: 24.9 pg — ABNORMAL LOW (ref 26.0–34.0)
MCHC: 31 g/dL (ref 30.0–36.0)
MCV: 80.5 fL (ref 80.0–100.0)
Platelets: 265 K/uL (ref 150–400)
RBC: 4.57 MIL/uL (ref 3.87–5.11)
RDW: 15.1 % (ref 11.5–15.5)
WBC: 8.6 K/uL (ref 4.0–10.5)
nRBC: 0 % (ref 0.0–0.2)

## 2024-01-17 LAB — TYPE AND SCREEN
ABO/RH(D): A POS
Antibody Screen: NEGATIVE

## 2024-01-17 LAB — RPR: RPR Ser Ql: NONREACTIVE

## 2024-01-18 NOTE — Anesthesia Preprocedure Evaluation (Signed)
 Anesthesia Evaluation  Patient identified by MRN, date of birth, ID band Patient awake    Reviewed: Allergy & Precautions, NPO status , Patient's Chart, lab work & pertinent test results  Airway Mallampati: I  TM Distance: >3 FB Neck ROM: Full    Dental no notable dental hx. (+) Teeth Intact, Dental Advisory Given   Pulmonary former smoker   Pulmonary exam normal breath sounds clear to auscultation       Cardiovascular Normal cardiovascular exam+ dysrhythmias  Rhythm:Regular Rate:Normal     Neuro/Psych  Headaches  Anxiety Depression       GI/Hepatic negative GI ROS, Neg liver ROS,,,  Endo/Other  diabetes    Renal/GU      Musculoskeletal   Abdominal   Peds  Hematology Lab Results      Component                Value               Date                      WBC                      8.6                 01/17/2024                HGB                      11.4 (L)            01/17/2024                HCT                      36.8                01/17/2024                MCV                      80.5                01/17/2024                PLT                      265                 01/17/2024              Anesthesia Other Findings All: Bactrim  Reproductive/Obstetrics                              Anesthesia Physical Anesthesia Plan  ASA: 3  Anesthesia Plan: Spinal   Post-op Pain Management: Regional block* and Toradol  IV (intra-op)*   Induction: Intravenous  PONV Risk Score and Plan: Treatment may vary due to age or medical condition and Ondansetron   Airway Management Planned: Natural Airway and Nasal Cannula  Additional Equipment: None  Intra-op Plan:   Post-operative Plan:   Informed Consent: I have reviewed the patients History and Physical, chart, labs and discussed the procedure including the risks, benefits and alternatives for the proposed anesthesia with the patient or  authorized representative who has indicated his/her understanding and acceptance.  Dental advisory given  Plan Discussed with: CRNA and Surgeon  Anesthesia Plan Comments: (38.2 G3P2 w BMI 36.6 for repeat C/S)         Anesthesia Quick Evaluation

## 2024-01-19 ENCOUNTER — Other Ambulatory Visit: Payer: Self-pay

## 2024-01-19 ENCOUNTER — Encounter (HOSPITAL_COMMUNITY)
Admission: RE | Disposition: A | Discharge status: Home / Self Care | Payer: Self-pay | Source: Home / Self Care | Attending: Obstetrics and Gynecology

## 2024-01-19 ENCOUNTER — Encounter (HOSPITAL_COMMUNITY): Payer: Self-pay | Admitting: Obstetrics and Gynecology

## 2024-01-19 ENCOUNTER — Inpatient Hospital Stay (HOSPITAL_COMMUNITY)
Admission: RE | Admit: 2024-01-19 | Discharge: 2024-01-22 | DRG: 787 | Disposition: A | Discharge status: Home / Self Care | Attending: Obstetrics and Gynecology | Admitting: Obstetrics and Gynecology

## 2024-01-19 ENCOUNTER — Inpatient Hospital Stay (HOSPITAL_COMMUNITY): Payer: Self-pay | Admitting: Anesthesiology

## 2024-01-19 ENCOUNTER — Encounter (HOSPITAL_COMMUNITY): Payer: Self-pay | Admitting: Anesthesiology

## 2024-01-19 DIAGNOSIS — O24425 Gestational diabetes mellitus in childbirth, controlled by oral hypoglycemic drugs: Secondary | ICD-10-CM | POA: Diagnosis present

## 2024-01-19 DIAGNOSIS — O34211 Maternal care for low transverse scar from previous cesarean delivery: Principal | ICD-10-CM | POA: Diagnosis present

## 2024-01-19 DIAGNOSIS — Z3A38 38 weeks gestation of pregnancy: Secondary | ICD-10-CM | POA: Diagnosis not present

## 2024-01-19 DIAGNOSIS — O99344 Other mental disorders complicating childbirth: Secondary | ICD-10-CM | POA: Diagnosis present

## 2024-01-19 DIAGNOSIS — O358XX0 Maternal care for other (suspected) fetal abnormality and damage, not applicable or unspecified: Secondary | ICD-10-CM | POA: Diagnosis present

## 2024-01-19 DIAGNOSIS — F419 Anxiety disorder, unspecified: Secondary | ICD-10-CM | POA: Diagnosis present

## 2024-01-19 DIAGNOSIS — O9081 Anemia of the puerperium: Secondary | ICD-10-CM | POA: Diagnosis not present

## 2024-01-19 DIAGNOSIS — O99214 Obesity complicating childbirth: Secondary | ICD-10-CM | POA: Diagnosis present

## 2024-01-19 DIAGNOSIS — D62 Acute posthemorrhagic anemia: Secondary | ICD-10-CM | POA: Diagnosis not present

## 2024-01-19 DIAGNOSIS — R519 Headache, unspecified: Secondary | ICD-10-CM | POA: Diagnosis not present

## 2024-01-19 DIAGNOSIS — Z87891 Personal history of nicotine dependence: Secondary | ICD-10-CM | POA: Diagnosis not present

## 2024-01-19 DIAGNOSIS — O43193 Other malformation of placenta, third trimester: Secondary | ICD-10-CM | POA: Diagnosis present

## 2024-01-19 DIAGNOSIS — Z833 Family history of diabetes mellitus: Secondary | ICD-10-CM | POA: Diagnosis not present

## 2024-01-19 DIAGNOSIS — Z9889 Other specified postprocedural states: Principal | ICD-10-CM

## 2024-01-19 DIAGNOSIS — Z98891 History of uterine scar from previous surgery: Principal | ICD-10-CM

## 2024-01-19 LAB — CBC
HCT: 31.5 % — ABNORMAL LOW (ref 36.0–46.0)
Hemoglobin: 10.1 g/dL — ABNORMAL LOW (ref 12.0–15.0)
MCH: 25.1 pg — ABNORMAL LOW (ref 26.0–34.0)
MCHC: 32.1 g/dL (ref 30.0–36.0)
MCV: 78.4 fL — ABNORMAL LOW (ref 80.0–100.0)
Platelets: 222 K/uL (ref 150–400)
RBC: 4.02 MIL/uL (ref 3.87–5.11)
RDW: 15.1 % (ref 11.5–15.5)
WBC: 13 K/uL — ABNORMAL HIGH (ref 4.0–10.5)
nRBC: 0 % (ref 0.0–0.2)

## 2024-01-19 LAB — GLUCOSE, CAPILLARY
Glucose-Capillary: 72 mg/dL (ref 70–99)
Glucose-Capillary: 66 mg/dL — ABNORMAL LOW (ref 70–99)
Glucose-Capillary: 82 mg/dL (ref 70–99)

## 2024-01-19 SURGERY — Surgical Case
Anesthesia: Spinal

## 2024-01-19 MED ORDER — NALOXONE HCL 0.4 MG/ML IJ SOLN: 0.4000 mg | INTRAMUSCULAR | Status: DC | PRN

## 2024-01-19 MED ORDER — SIMETHICONE 80 MG PO CHEW
80.0000 mg | CHEWABLE_TABLET | Freq: Three times a day (TID) | ORAL | Status: DC
  Administered 2024-01-19 – 2024-01-22 (×8): 80 mg via ORAL
  Filled 2024-01-19 (×9): qty 1

## 2024-01-19 MED ORDER — ZOLPIDEM TARTRATE 5 MG PO TABS: 5.0000 mg | ORAL_TABLET | Freq: Every evening | ORAL | Status: DC | PRN

## 2024-01-19 MED ORDER — OXYCODONE HCL 5 MG PO TABS: 5.0000 mg | ORAL_TABLET | Freq: Once | ORAL | Status: DC | PRN

## 2024-01-19 MED ORDER — STERILE WATER FOR IRRIGATION IR SOLN
Status: DC | PRN
  Administered 2024-01-19: 1000 mL

## 2024-01-19 MED ORDER — ACETAMINOPHEN 10 MG/ML IV SOLN
INTRAVENOUS | Status: DC | PRN
  Administered 2024-01-19: 1000 mg via INTRAVENOUS

## 2024-01-19 MED ORDER — ALBUMIN HUMAN 5 % IV SOLN
INTRAVENOUS | Status: AC
Start: 2024-01-19 — End: 2024-01-19
  Filled 2024-01-19: qty 250

## 2024-01-19 MED ORDER — PHENYLEPHRINE HCL (PRESSORS) 10 MG/ML IV SOLN
INTRAVENOUS | Status: DC | PRN
  Administered 2024-01-19: 80 ug via INTRAVENOUS
  Administered 2024-01-19: 240 ug via INTRAVENOUS
  Administered 2024-01-19: 80 ug via INTRAVENOUS
  Administered 2024-01-19: 160 ug via INTRAVENOUS

## 2024-01-19 MED ORDER — WITCH HAZEL-GLYCERIN EX PADS: 1.0000 | MEDICATED_PAD | CUTANEOUS | Status: DC | PRN

## 2024-01-19 MED ORDER — OXYTOCIN-SODIUM CHLORIDE 30-0.9 UT/500ML-% IV SOLN
INTRAVENOUS | Status: DC | PRN
  Administered 2024-01-19: 300 mL via INTRAVENOUS

## 2024-01-19 MED ORDER — SOD CITRATE-CITRIC ACID 500-334 MG/5ML PO SOLN
ORAL | Status: AC
  Filled 2024-01-19: qty 30

## 2024-01-19 MED ORDER — METOCLOPRAMIDE HCL 5 MG/ML IJ SOLN
INTRAMUSCULAR | Status: AC
  Filled 2024-01-19: qty 2

## 2024-01-19 MED ORDER — DEXAMETHASONE SODIUM PHOSPHATE 4 MG/ML IJ SOLN
INTRAMUSCULAR | Status: AC
  Filled 2024-01-19: qty 2

## 2024-01-19 MED ORDER — ONDANSETRON HCL 4 MG/2ML IJ SOLN: 4.0000 mg | Freq: Three times a day (TID) | INTRAMUSCULAR | Status: DC | PRN

## 2024-01-19 MED ORDER — PROPOFOL 10 MG/ML IV BOLUS
INTRAVENOUS | Status: AC
  Filled 2024-01-19: qty 20

## 2024-01-19 MED ORDER — ACETAMINOPHEN 500 MG PO TABS
1000.0000 mg | ORAL_TABLET | Freq: Four times a day (QID) | ORAL | Status: DC
  Administered 2024-01-19 – 2024-01-21 (×8): 1000 mg via ORAL
  Filled 2024-01-19 (×10): qty 2

## 2024-01-19 MED ORDER — DIBUCAINE (PERIANAL) 1 % EX OINT: 1.0000 | TOPICAL_OINTMENT | CUTANEOUS | Status: DC | PRN

## 2024-01-19 MED ORDER — OXYCODONE HCL 5 MG PO TABS
5.0000 mg | ORAL_TABLET | ORAL | Status: DC | PRN
  Administered 2024-01-20: 10 mg via ORAL
  Administered 2024-01-20 (×2): 5 mg via ORAL
  Filled 2024-01-19 (×4): qty 1

## 2024-01-19 MED ORDER — GABAPENTIN 100 MG PO CAPS
100.0000 mg | ORAL_CAPSULE | Freq: Two times a day (BID) | ORAL | Status: DC
  Administered 2024-01-19 – 2024-01-22 (×7): 100 mg via ORAL
  Filled 2024-01-19 (×7): qty 1

## 2024-01-19 MED ORDER — ONDANSETRON HCL 4 MG/2ML IJ SOLN
INTRAMUSCULAR | Status: AC
  Filled 2024-01-19: qty 2

## 2024-01-19 MED ORDER — FENTANYL CITRATE (PF) 100 MCG/2ML IJ SOLN
INTRAMUSCULAR | Status: AC
  Filled 2024-01-19: qty 2

## 2024-01-19 MED ORDER — TRANEXAMIC ACID-NACL 1000-0.7 MG/100ML-% IV SOLN
1000.0000 mg | Freq: Once | INTRAVENOUS | Status: AC
  Administered 2024-01-19 (×2): 1000 mg via INTRAVENOUS

## 2024-01-19 MED ORDER — MEPERIDINE HCL 25 MG/ML IJ SOLN: 6.2500 mg | INTRAMUSCULAR | Status: DC | PRN

## 2024-01-19 MED ORDER — ACETAMINOPHEN 10 MG/ML IV SOLN
INTRAVENOUS | Status: AC
  Filled 2024-01-19: qty 100

## 2024-01-19 MED ORDER — SENNOSIDES-DOCUSATE SODIUM 8.6-50 MG PO TABS
2.0000 | ORAL_TABLET | Freq: Every day | ORAL | Status: DC
  Administered 2024-01-20 – 2024-01-22 (×3): 2 via ORAL
  Filled 2024-01-19 (×3): qty 2

## 2024-01-19 MED ORDER — METOCLOPRAMIDE HCL 5 MG/ML IJ SOLN
INTRAMUSCULAR | Status: DC | PRN
  Administered 2024-01-19: 10 mg via INTRAVENOUS

## 2024-01-19 MED ORDER — CEFAZOLIN SODIUM-DEXTROSE 2-4 GM/100ML-% IV SOLN
INTRAVENOUS | Status: AC
  Filled 2024-01-19: qty 100

## 2024-01-19 MED ORDER — BUPROPION HCL ER (XL) 150 MG PO TB24
150.0000 mg | ORAL_TABLET | Freq: Every morning | ORAL | Status: DC
  Administered 2024-01-20 – 2024-01-22 (×3): 150 mg via ORAL
  Filled 2024-01-19 (×3): qty 1

## 2024-01-19 MED ORDER — PROPOFOL 500 MG/50ML IV EMUL
INTRAVENOUS | Status: DC | PRN
  Administered 2024-01-19: 20 mg via INTRAVENOUS

## 2024-01-19 MED ORDER — KETOROLAC TROMETHAMINE 30 MG/ML IJ SOLN
INTRAMUSCULAR | Status: AC
  Filled 2024-01-19: qty 1

## 2024-01-19 MED ORDER — BUPIVACAINE IN DEXTROSE 0.75-8.25 % IT SOLN
INTRATHECAL | Status: DC | PRN
  Administered 2024-01-19: 12 mg via INTRATHECAL

## 2024-01-19 MED ORDER — HYDROMORPHONE HCL 1 MG/ML IJ SOLN: 0.2500 mg | INTRAMUSCULAR | Status: DC | PRN

## 2024-01-19 MED ORDER — KETOROLAC TROMETHAMINE 30 MG/ML IJ SOLN
30.0000 mg | Freq: Four times a day (QID) | INTRAMUSCULAR | Status: AC
  Administered 2024-01-19 – 2024-01-20 (×3): 30 mg via INTRAVENOUS
  Filled 2024-01-19 (×3): qty 1

## 2024-01-19 MED ORDER — SODIUM CHLORIDE 0.9% FLUSH: 3.0000 mL | INTRAVENOUS | Status: DC | PRN

## 2024-01-19 MED ORDER — DIPHENHYDRAMINE HCL 50 MG/ML IJ SOLN: 12.5000 mg | INTRAMUSCULAR | Status: DC | PRN

## 2024-01-19 MED ORDER — DIPHENHYDRAMINE HCL 25 MG PO CAPS
25.0000 mg | ORAL_CAPSULE | ORAL | Status: DC | PRN
  Administered 2024-01-22 (×3): 25 mg via ORAL
  Filled 2024-01-19 (×2): qty 1

## 2024-01-19 MED ORDER — TRANEXAMIC ACID-NACL 1000-0.7 MG/100ML-% IV SOLN
INTRAVENOUS | Status: AC
  Filled 2024-01-19: qty 100

## 2024-01-19 MED ORDER — PRENATAL MULTIVITAMIN CH
1.0000 | ORAL_TABLET | Freq: Every day | ORAL | Status: DC
  Administered 2024-01-20 – 2024-01-22 (×3): 1 via ORAL
  Filled 2024-01-19 (×4): qty 1

## 2024-01-19 MED ORDER — PHENYLEPHRINE 80 MCG/ML (10ML) SYRINGE FOR IV PUSH (FOR BLOOD PRESSURE SUPPORT)
PREFILLED_SYRINGE | INTRAVENOUS | Status: AC
  Filled 2024-01-19: qty 10

## 2024-01-19 MED ORDER — SIMETHICONE 80 MG PO CHEW: 80.0000 mg | CHEWABLE_TABLET | ORAL | Status: DC | PRN

## 2024-01-19 MED ORDER — MORPHINE SULFATE (PF) 0.5 MG/ML IJ SOLN
INTRAMUSCULAR | Status: DC | PRN
  Administered 2024-01-19: 150 ug via INTRATHECAL

## 2024-01-19 MED ORDER — LACTATED RINGERS IV SOLN: INTRAVENOUS | Status: DC | PRN

## 2024-01-19 MED ORDER — IBUPROFEN 600 MG PO TABS
600.0000 mg | ORAL_TABLET | Freq: Four times a day (QID) | ORAL | Status: DC
  Administered 2024-01-20 – 2024-01-22 (×8): 600 mg via ORAL
  Filled 2024-01-19 (×9): qty 1

## 2024-01-19 MED ORDER — ONDANSETRON HCL 4 MG/2ML IJ SOLN: 4.0000 mg | Freq: Once | INTRAMUSCULAR | Status: DC | PRN

## 2024-01-19 MED ORDER — SCOPOLAMINE 1 MG/3DAYS TD PT72
MEDICATED_PATCH | TRANSDERMAL | Status: AC
  Filled 2024-01-19: qty 1

## 2024-01-19 MED ORDER — MENTHOL 3 MG MT LOZG: 1.0000 | LOZENGE | OROMUCOSAL | Status: DC | PRN

## 2024-01-19 MED ORDER — OXYTOCIN-SODIUM CHLORIDE 30-0.9 UT/500ML-% IV SOLN
2.5000 [IU]/h | INTRAVENOUS | Status: AC
  Administered 2024-01-19: 2.5 [IU]/h via INTRAVENOUS
  Filled 2024-01-19: qty 500

## 2024-01-19 MED ORDER — FENTANYL CITRATE (PF) 100 MCG/2ML IJ SOLN
INTRAMUSCULAR | Status: DC | PRN
  Administered 2024-01-19: 15 ug via INTRATHECAL

## 2024-01-19 MED ORDER — KETOROLAC TROMETHAMINE 30 MG/ML IJ SOLN
30.0000 mg | Freq: Once | INTRAMUSCULAR | Status: AC | PRN
  Administered 2024-01-19: 30 mg via INTRAVENOUS

## 2024-01-19 MED ORDER — ESCITALOPRAM OXALATE 10 MG PO TABS
20.0000 mg | ORAL_TABLET | Freq: Every morning | ORAL | Status: DC
  Administered 2024-01-20 – 2024-01-22 (×3): 20 mg via ORAL
  Filled 2024-01-19 (×3): qty 2

## 2024-01-19 MED ORDER — COCONUT OIL OIL: 1.0000 | TOPICAL_OIL | Status: DC | PRN

## 2024-01-19 MED ORDER — DIPHENHYDRAMINE HCL 25 MG PO CAPS
25.0000 mg | ORAL_CAPSULE | Freq: Four times a day (QID) | ORAL | Status: DC | PRN
  Filled 2024-01-19: qty 1

## 2024-01-19 MED ORDER — NALOXONE HCL 4 MG/10ML IJ SOLN: 1.0000 ug/kg/h | INTRAVENOUS | Status: DC | PRN

## 2024-01-19 MED ORDER — SOD CITRATE-CITRIC ACID 500-334 MG/5ML PO SOLN
30.0000 mL | ORAL | Status: AC
  Administered 2024-01-19: 30 mL via ORAL

## 2024-01-19 MED ORDER — PHENYLEPHRINE HCL-NACL 20-0.9 MG/250ML-% IV SOLN
INTRAVENOUS | Status: DC | PRN
  Administered 2024-01-19: 60 ug/min via INTRAVENOUS

## 2024-01-19 MED ORDER — OXYTOCIN-SODIUM CHLORIDE 30-0.9 UT/500ML-% IV SOLN
INTRAVENOUS | Status: AC
  Filled 2024-01-19: qty 500

## 2024-01-19 MED ORDER — OXYCODONE HCL 5 MG/5ML PO SOLN: 5.0000 mg | Freq: Once | ORAL | Status: DC | PRN

## 2024-01-19 MED ORDER — SCOPOLAMINE 1 MG/3DAYS TD PT72
MEDICATED_PATCH | TRANSDERMAL | Status: DC | PRN
  Administered 2024-01-19: 1 via TRANSDERMAL

## 2024-01-19 MED ORDER — PHENYLEPHRINE 80 MCG/ML (10ML) SYRINGE FOR IV PUSH (FOR BLOOD PRESSURE SUPPORT)
PREFILLED_SYRINGE | INTRAVENOUS | Status: AC
  Filled 2024-01-19: qty 20

## 2024-01-19 MED ORDER — PHENYLEPHRINE HCL-NACL 20-0.9 MG/250ML-% IV SOLN
INTRAVENOUS | Status: AC
  Filled 2024-01-19: qty 250

## 2024-01-19 MED ORDER — ALBUMIN HUMAN 5 % IV SOLN: INTRAVENOUS | Status: DC | PRN

## 2024-01-19 MED ORDER — MORPHINE SULFATE (PF) 0.5 MG/ML IJ SOLN
INTRAMUSCULAR | Status: AC
  Filled 2024-01-19: qty 10

## 2024-01-19 MED ORDER — SCOPOLAMINE 1 MG/3DAYS TD PT72
1.0000 | MEDICATED_PATCH | Freq: Once | TRANSDERMAL | Status: AC
  Administered 2024-01-19: 1 mg via TRANSDERMAL

## 2024-01-19 MED ORDER — ONDANSETRON HCL 4 MG/2ML IJ SOLN
INTRAMUSCULAR | Status: DC | PRN
  Administered 2024-01-19: 4 mg via INTRAVENOUS

## 2024-01-19 MED ORDER — SODIUM CHLORIDE 0.9 % IR SOLN
Status: DC | PRN
  Administered 2024-01-19: 1

## 2024-01-19 MED ORDER — DEXAMETHASONE SODIUM PHOSPHATE 10 MG/ML IJ SOLN
INTRAMUSCULAR | Status: DC | PRN
  Administered 2024-01-19: 10 mg via INTRAVENOUS

## 2024-01-19 MED ORDER — CEFAZOLIN SODIUM-DEXTROSE 2-4 GM/100ML-% IV SOLN
2.0000 g | INTRAVENOUS | Status: AC
  Administered 2024-01-19: 2 g via INTRAVENOUS

## 2024-01-19 SURGICAL SUPPLY — 32 items
CHLORAPREP W/TINT 26 (MISCELLANEOUS) ×4 IMPLANT
CLAMP UMBILICAL CORD (MISCELLANEOUS) ×2 IMPLANT
CLOTH BEACON ORANGE TIMEOUT ST (SAFETY) ×2 IMPLANT
DERMABOND ADVANCED .7 DNX12 (GAUZE/BANDAGES/DRESSINGS) IMPLANT
DRSG OPSITE POSTOP 4X10 (GAUZE/BANDAGES/DRESSINGS) ×2 IMPLANT
ELECTRODE REM PT RTRN 9FT ADLT (ELECTROSURGICAL) ×2 IMPLANT
EXTRACTOR VACUUM KIWI (MISCELLANEOUS) IMPLANT
GLOVE BIO SURGEON STRL SZ 6.5 (GLOVE) ×2 IMPLANT
GLOVE BIOGEL PI IND STRL 7.0 (GLOVE) ×2 IMPLANT
GOWN STRL REUS W/TWL LRG LVL3 (GOWN DISPOSABLE) ×4 IMPLANT
KIT ABG SYR 3ML LUER SLIP (SYRINGE) IMPLANT
MAT PREVALON FULL STRYKER (MISCELLANEOUS) IMPLANT
NDL HYPO 25X5/8 SAFETYGLIDE (NEEDLE) IMPLANT
NEEDLE HYPO 22GX1.5 SAFETY (NEEDLE) IMPLANT
NEEDLE HYPO 25X5/8 SAFETYGLIDE (NEEDLE) IMPLANT
NS IRRIG 1000ML POUR BTL (IV SOLUTION) ×2 IMPLANT
PACK C SECTION WH (CUSTOM PROCEDURE TRAY) ×2 IMPLANT
PAD OB MATERNITY 4.3X12.25 (PERSONAL CARE ITEMS) ×2 IMPLANT
RETRACTOR TRAXI PANNICULUS (MISCELLANEOUS) IMPLANT
RTRCTR C-SECT PINK 25CM LRG (MISCELLANEOUS) ×2 IMPLANT
SPONGE T-LAP 18X18 ~~LOC~~+RFID (SPONGE) IMPLANT
SUT MNCRL 0 VIOLET CTX 36 (SUTURE) ×2 IMPLANT
SUT PDS AB 0 CTX 60 (SUTURE) ×2 IMPLANT
SUT VIC AB 0 CTX36XBRD ANBCTRL (SUTURE) ×2 IMPLANT
SUT VIC AB 4-0 KS 27 (SUTURE) IMPLANT
SUT VICRYL+ 3-0 36IN CT-1 (SUTURE) ×2 IMPLANT
SUTURE PLAIN GUT 2.0 ETHICON (SUTURE) IMPLANT
SYR 30ML LL (SYRINGE) IMPLANT
TOWEL OR 17X24 6PK STRL BLUE (TOWEL DISPOSABLE) ×2 IMPLANT
TRAY FOLEY W/BAG SLVR 14FR LF (SET/KITS/TRAYS/PACK) ×2 IMPLANT
WATER STERILE IRR 1000ML POUR (IV SOLUTION) ×2 IMPLANT
YANKAUER SUCT BULB TIP NO VENT (SUCTIONS) IMPLANT

## 2024-01-19 NOTE — Progress Notes (Signed)
 Subjective: Postpartum Day 1: Cesarean Delivery  Patient is doing well, just reports she is tired. Pain well controlled current meds. Voiding via foley catheter and has not yet ambulated. Tolerating regular diet without N/V. Has not passed flatus. Lochia minimal.   Objective: Vital signs in last 24 hours: Temp:  [97.9 F (36.6 C)-98.2 F (36.8 C)] 98.2 F (36.8 C) (10/02 1315) Pulse Rate:  [65-78] 71 (10/02 1315) Resp:  [10-23] 18 (10/02 1315) BP: (93-104)/(47-66) 98/47 (10/02 1315) SpO2:  [96 %-100 %] 100 % (10/02 1315) Weight:  [112.4 kg] 112.4 kg (10/02 0542)  Physical Exam:  General: alert and no distress Cardio: regular rate, WWP Lungs: no increased work of breathing  Abdomen: uterine fundus firm, appropriately tender Incision: dressing in place w/o strike-through DVT Evaluation: SCD in place  Recent Labs    01/17/24 0918 01/19/24 1241  HGB 11.4* 10.1*  HCT 36.8 31.5*    Assessment/Plan: Status post Cesarean section.Doing well.  Postoperative course complicated by PPG (EBL 1187). Continue post-operative care, foley until POD1.   Pregnancy Problems: Obese - BMI 36, pLov on POD1 A2GDM - FSBS in AM Anxiety: on Wellbutrin  and escitalopram   Hx c/s x 2 Succenturiate placenta  AMA  Acute blood loss anemia: EBL 1187, POD0 Hgb 10.1 from 11.4 pre-op. 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  Needs postnatal ultrasound and peds uro consult after delivery.  [facility] recommended fetal ECHO, however it could not be scheduled prior to delivery.  Assess anatomy prior to circ  Female infant  - circ orders placed    Charmaine CHRISTELLA Oz, MD 01/19/2024, 2:36 PM

## 2024-01-19 NOTE — Anesthesia Postprocedure Evaluation (Signed)
 Anesthesia Post Note  Patient: Brenda Mcdaniel  Procedure(s) Performed: CESAREAN DELIVERY     Patient location during evaluation: Mother Baby Anesthesia Type: Spinal Level of consciousness: oriented and awake and alert Pain management: pain level controlled Vital Signs Assessment: post-procedure vital signs reviewed and stable Respiratory status: spontaneous breathing and respiratory function stable Cardiovascular status: blood pressure returned to baseline and stable Postop Assessment: no headache, no backache, no apparent nausea or vomiting and able to ambulate Anesthetic complications: no   No notable events documented.  Last Vitals:  Vitals:   01/19/24 1100 01/19/24 1112  BP: (!) 101/59 (!) 99/55  Pulse: 68   Resp: 16 20  Temp:  36.6 C  SpO2: 96% 97%    Last Pain:  Vitals:   01/19/24 1112  TempSrc: Oral  PainSc: 0-No pain   Pain Goal:    LLE Motor Response: Purposeful movement (01/19/24 1100) LLE Sensation: Increased (01/19/24 1100) RLE Motor Response: Purposeful movement (01/19/24 1100) RLE Sensation: Increased (01/19/24 1100)     Epidural/Spinal Function Cutaneous sensation: Normal sensation (01/19/24 1112), Patient able to flex knees: Yes (01/19/24 1100), Patient able to lift hips off bed: No (01/19/24 1100), Back pain beyond tenderness at insertion site: No (01/19/24 1100), Progressively worsening motor and/or sensory loss: No (01/19/24 1100), Bowel and/or bladder incontinence post epidural: No (01/19/24 1100)  Garnette DELENA Gab

## 2024-01-19 NOTE — Lactation Note (Signed)
 This note was copied from a baby's chart. Lactation Consultation Note  Patient Name: Brenda Mcdaniel Unijb'd Date: 01/19/2024 Age:36 hours   Mom chooses to formula feed.  Maternal Data    Feeding    LATCH Score                    Lactation Tools Discussed/Used    Interventions    Discharge    Consult Status Consult Status: Complete    [name] 01/19/2024, 7:21 PM

## 2024-01-19 NOTE — Progress Notes (Signed)
 At 0600, pt appeared cool, clammy, and stated she felt like she was going to pass out. CBG taken, and it was 72. Pt stated this was low for her and she's usually in the 80's. Anesthesiology notified, and RN was directed to retake CBG in 30 min. Retake at [phone] was 66. Anesthesiology notified again, and there were no new orders given at this time.

## 2024-01-19 NOTE — Op Note (Signed)
 CESAREAN SECTION Procedure Note  Patient: Brenda Mcdaniel  Preoperative Diagnosis: IUP at [redacted]w[redacted]d, gDMA2, history of prior C-sections x2 Postoperative Diagnosis: same, delivered, postpartum hemorrhage  Procedure: repeat low transverse C-section     Surgeon: Rubie Husky, MD  Assistant surgeon: Charmaine Oz, MD  A skilled assistant was required for this case due to its complexity.  Anesthesia: Spinal anesthesia   Findings: Normal appearing uterus, with very thin myometrium at site of prior low transverse C-sections, normal fallopian tubes bilaterally, and normal ovaries bilaterally.  Viable female infant in vertex presentation delivered at 08:36 with weight 7lb6.2oz Apgars 8 and 8.  Estimated Blood Loss:  1112cc         Specimens: Placenta to pathology         Complications:  postpartum hemorrhage         Disposition: PACU - hemodynamically stable.         Condition: stable    Description of Procedure: The patient was taken to the operating room where spinal anesthesia was placed and found to be adequate. The patient was placed in the dorsal supine position. SCD were applied and cycling. A foley catheter was inserted and draining. A routine pre-operative time out was performed. Ancef  2g were given for infection prophylaxis. The patient was subsequently prepped and draped in the normal sterile fashion.  A Pfannenstiel skin incision was made with a scalpel at the site of prior skin incisions and carried down to the level of the fascia.  The fascia was incised in the midline with the scalpel and extended laterally with curved Mayo scissors.  Kocher clamps were applied to the superior fascial edge and the fascia was dissected off the rectus muscle sharply using the Mayo scissors. The rectus muscles then were noted to be thickly scarred to underlying peritoneum and bladder reflection. Approximately 10 minutes of adhesiolysis was performed in attempt to separate the rectus from the  underlying peritoneum. A superior area of the peritoneum was found free of adherent bowel and the peritoneal cavity was entered sharply.Kocher clamps were applied to the inferior fascial edge and the fascia was dissected off the rectus muscle sharply using the Mayo scissors. An additional 15 minutes of adhesiolysis was performed to safely separate the peritoneum and bladder reflection from scar tissue and rectus muscle.   The uterus was identified and the alexis retractor was placed intraperitoneal.  A bladder flap was then created sharply with Metzenbaum scissors and separated from the lower uterine segment digitally.   A low transverse hysterotomy was then made with a scalpel. The infant was found in the vertex presentation. The amniotic membranes were ruptured with an Allis clamp.  The infant was delivered atraumatically and without difficulty with standard maneuvers. No nuchal. Baby had good tone and cry and routine bulb suction of nares was performed. After 60 seconds of delayed cord clamping the cord was clamped and cut and the infant was handed off to the baby nurse. Cord blood was collected The placenta was delivered with gentle traction on umbilical cord, then manual removal. The placenta was inspected and both lobes of the succenturiate placenta were noted to have been removed and the placenta was intact.  The uterus was cleared of all clot and debris.  The hysterotomy was then closed with 0 monocryl in a running locked fashion. The hysterotomy was found to be hemostatic after light Bovie use on serosal edges. The Alexis retractor was removed. The fascia was closed with a 0 Vicryl suture in a continuous  running fashion.  The subcutaneous tissue was irrigated and rendered hemostatic with cautery.  The subcutaneous layer was subsequently closed with 2-0 plain gut in an interrupted fashion.  The skin was closed with 4-0 vicryl  in a running subcuticular fashion.  Sponge, lap and needle counts were  correct. Dermabond and a Honeycomb dressing were placed on the incision.  The patient and baby were both doing well when I left the OR.  Rubie Husky, MD

## 2024-01-19 NOTE — H&P (Signed)
 Brenda Mcdaniel is a 38 y.o. G6P2002 female at [redacted]w[redacted]d presenting for scheduled repeat C-section. She is concerned about her CBG of 72, but reports that she is asymptomatic. She reports good fetal movement and denies LOF, VB, and contractions.   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 Needs postnatal ultrasound and peds uro consult after delivery. [facility] recommended fetal ECHO, however it could not be scheduled prior to delivery. -resolved polyhydramnios OB History     Gravida  3   Para  2   Term  2   Preterm      AB      Living  3      SAB      IAB      Ectopic      Multiple  1   Live Births  3          Past Medical History:  Diagnosis Date   Anxiety    Complication of anesthesia    told heart rate dropped with anesthesia as a child   Depression    Dysrhythmia    supine tachycardia has seen cardiologist.  Dr Brenda has notes   Gestational diabetes    Headache(784.0)    Polycystic ovarian disease    Past Surgical History:  Procedure Laterality Date   CESAREAN SECTION N/A 05/18/2022   Procedure: CESAREAN SECTION;  Surgeon: Brenda Gums, MD;  Location: [facility] LD ORS;  Service: Obstetrics;  Laterality: N/A;   CESAREAN SECTION MULTI-GESTATIONAL N/A 02/19/2020   Procedure: CESAREAN SECTION MULTI-GESTATIONAL;  Surgeon: Brenda Browning, MD;  Location: [facility] LD ORS;  Service: Obstetrics;  Laterality: N/A;   TONSILLECTOMY     Family History: family history includes Alcohol abuse in her father; Depression in her maternal aunt, mother, paternal aunt, and paternal uncle; Diabetes in her maternal grandfather; Schizophrenia in her father and maternal grandfather. Social History:  reports that she has quit smoking. Her smoking use included cigarettes. She has  never used smokeless tobacco. She reports current alcohol use. She reports that she does not use drugs.     Maternal Diabetes: Yes:  Diabetes Type:  Insulin/Medication controlled Genetic Screening: Normal Maternal Ultrasounds/Referrals: Fetal Kidney Anomalies: horseshoe kidney Fetal Ultrasounds or other Referrals:  Referred to Materal Fetal Medicine  Maternal Substance Abuse:  No Significant Maternal Medications:  Meds include: Other: wellbutrin , escitalopram , metformin Significant Maternal Lab Results:  Group B Strep negative Number of Prenatal Visits:greater than 3 verified prenatal visits Maternal Vaccinations:TDap and Flu   Review of Systems  All other systems reviewed and are negative.  Maternal Medical History:  Prenatal complications: No bleeding, cholelithiasis, HIV, PIH, infection, IUGR, nephrolithiasis, oligohydramnios, placental abnormality, polyhydramnios, pre-eclampsia, preterm labor, substance abuse, thrombocytopenia or thrombophilia.   Prenatal Complications - Diabetes: gestational. Diabetes is managed by oral agent (monotherapy).       Height 5' 9 (1.753 m), weight 112.4 kg, unknown if currently breastfeeding. Maternal Exam:  Abdomen: Patient reports no abdominal tenderness.   Fetal Exam Fetal Monitor Review: Mode: fetoscope.   Baseline rate: 144.    Physical Exam HENT:     Head: Normocephalic.     Nose: Nose normal.  Eyes:     Extraocular Movements: Extraocular movements intact.  Cardiovascular:     Comments: Well perfused Pulmonary:     Effort: Pulmonary effort is normal.  Abdominal:  Comments: Gravid, non-tender  Musculoskeletal:        General: Normal range of motion.     Cervical back: Normal range of motion.  Skin:    General: Skin is warm and dry.  Neurological:     General: No focal deficit present.     Mental Status: She is oriented to person, place, and time.  Psychiatric:        Mood and Affect: Mood normal.        Behavior:  Behavior normal.        Thought Content: Thought content normal.        Judgment: Judgment normal.     Prenatal labs: ABO, Rh: --/--/A POS (09/30 0920) Antibody: NEG (09/30 0920) Rubella: Immune (03/24 0000) RPR: NON REACTIVE (09/30 0918)  HBsAg: Negative (03/24 0000)  HIV: Non-reactive (03/24 0000)  GBS:     Assessment/Plan: Brenda Mcdaniel is a 38 y.o. G1P2002 female at [redacted]w[redacted]d presenting for scheduled repeat C-section.  -rCS: Consent signed as below. 2g Ancef . Plan for traxxi. SCDs for VTE ppx.  We previously thoroughly discussed the risks and benefits of of a C-section. Risks include bleeding (she would accept a blood transfusion in an emergency), damage to surrounding structures, and infection. We discussed that any of these complications could lead to the need for longer hospitalization, need for additional procedures/surgeries, or need for more medications. All questions were answered and patient voiced understanding of risks. She would like to proceed with the surgery.   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 Did not take last night or this morning.  -Succenturiate placenta: High OBH risk, will plan for TXA at start of surgery and ensure complete removal of placenta.  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 Needs postnatal ultrasound and peds uro consult after delivery. [facility] recommended fetal ECHO, however it could not be scheduled prior to delivery. -resolved polyhydramnios   Welcome baby boy Brenda Mcdaniel! Mom Brenda Mcdaniel is support person.   Dispo: To OR.   Brenda Mcdaniel A Brenda Mcdaniel 01/19/2024, 7:33 AM

## 2024-01-19 NOTE — Plan of Care (Signed)
  Problem: Education: Goal: Knowledge of General Education information will improve Description: Including pain rating scale, medication(s)/side effects and non-pharmacologic comfort measures Outcome: Progressing   Problem: Health Behavior/Discharge Planning: Goal: Ability to manage health-related needs will improve Outcome: Progressing   Problem: Clinical Measurements: Goal: Ability to maintain clinical measurements within normal limits will improve Outcome: Progressing Goal: Will remain free from infection Outcome: Progressing Goal: Diagnostic test results will improve Outcome: Progressing Goal: Respiratory complications will improve Outcome: Progressing Goal: Cardiovascular complication will be avoided Outcome: Progressing   Problem: Activity: Goal: Risk for activity intolerance will decrease Outcome: Progressing   Problem: Nutrition: Goal: Adequate nutrition will be maintained Outcome: Progressing   Problem: Coping: Goal: Level of anxiety will decrease Outcome: Progressing   Problem: Elimination: Goal: Will not experience complications related to bowel motility Outcome: Progressing Goal: Will not experience complications related to urinary retention Outcome: Progressing   Problem: Pain Managment: Goal: General experience of comfort will improve and/or be controlled Outcome: Progressing   Problem: Safety: Goal: Ability to remain free from injury will improve Outcome: Progressing   Problem: Skin Integrity: Goal: Risk for impaired skin integrity will decrease Outcome: Progressing   Problem: Education: Goal: Ability to describe self-care measures that may prevent or decrease complications (Diabetes Survival Skills Education) will improve Outcome: Progressing Goal: Individualized Educational Video(s) Outcome: Progressing   Problem: Coping: Goal: Ability to adjust to condition or change in health will improve Outcome: Progressing   Problem: Fluid  Volume: Goal: Ability to maintain a balanced intake and output will improve Outcome: Progressing   Problem: Health Behavior/Discharge Planning: Goal: Ability to identify and utilize available resources and services will improve Outcome: Progressing Goal: Ability to manage health-related needs will improve Outcome: Progressing   Problem: Metabolic: Goal: Ability to maintain appropriate glucose levels will improve Outcome: Progressing   Problem: Nutritional: Goal: Maintenance of adequate nutrition will improve Outcome: Progressing Goal: Progress toward achieving an optimal weight will improve Outcome: Progressing   Problem: Skin Integrity: Goal: Risk for impaired skin integrity will decrease Outcome: Progressing   Problem: Tissue Perfusion: Goal: Adequacy of tissue perfusion will improve Outcome: Progressing   Problem: Education: Goal: Knowledge of condition will improve Outcome: Progressing Goal: Individualized Educational Video(s) Outcome: Progressing Goal: Individualized Newborn Educational Video(s) Outcome: Progressing   Problem: Activity: Goal: Will verbalize the importance of balancing activity with adequate rest periods Outcome: Progressing Goal: Ability to tolerate increased activity will improve Outcome: Progressing   Problem: Coping: Goal: Ability to identify and utilize available resources and services will improve Outcome: Progressing   Problem: Life Cycle: Goal: Chance of risk for complications during the postpartum period will decrease Outcome: Progressing   Problem: Role Relationship: Goal: Ability to demonstrate positive interaction with newborn will improve Outcome: Progressing   Problem: Skin Integrity: Goal: Demonstration of wound healing without infection will improve Outcome: Progressing   Problem: Education: Goal: Knowledge of the prescribed therapeutic regimen will improve Outcome: Progressing Goal: Understanding of sexual limitations  or changes related to disease process or condition will improve Outcome: Progressing Goal: Individualized Educational Video(s) Outcome: Progressing   Problem: Self-Concept: Goal: Communication of feelings regarding changes in body function or appearance will improve Outcome: Progressing   Problem: Skin Integrity: Goal: Demonstration of wound healing without infection will improve Outcome: Progressing

## 2024-01-19 NOTE — Plan of Care (Signed)
  Problem: Education: Goal: Knowledge of General Education information will improve Description: Including pain rating scale, medication(s)/side effects and non-pharmacologic comfort measures Outcome: Progressing   Problem: Health Behavior/Discharge Planning: Goal: Ability to manage health-related needs will improve Outcome: Progressing   Problem: Clinical Measurements: Goal: Ability to maintain clinical measurements within normal limits will improve Outcome: Progressing Goal: Will remain free from infection Outcome: Progressing Goal: Diagnostic test results will improve Outcome: Progressing Goal: Respiratory complications will improve Outcome: Progressing Goal: Cardiovascular complication will be avoided Outcome: Progressing   Problem: Activity: Goal: Risk for activity intolerance will decrease Outcome: Progressing   Problem: Nutrition: Goal: Adequate nutrition will be maintained Outcome: Progressing   Problem: Coping: Goal: Level of anxiety will decrease Outcome: Progressing   Problem: Elimination: Goal: Will not experience complications related to bowel motility Outcome: Progressing Goal: Will not experience complications related to urinary retention Outcome: Progressing   Problem: Pain Managment: Goal: General experience of comfort will improve and/or be controlled Outcome: Progressing   Problem: Safety: Goal: Ability to remain free from injury will improve Outcome: Progressing   Problem: Skin Integrity: Goal: Risk for impaired skin integrity will decrease Outcome: Progressing   Problem: Education: Goal: Ability to describe self-care measures that may prevent or decrease complications (Diabetes Survival Skills Education) will improve Outcome: Progressing Goal: Individualized Educational Video(s) Outcome: Progressing   Problem: Coping: Goal: Ability to adjust to condition or change in health will improve Outcome: Progressing   Problem: Fluid  Volume: Goal: Ability to maintain a balanced intake and output will improve Outcome: Progressing   Problem: Health Behavior/Discharge Planning: Goal: Ability to identify and utilize available resources and services will improve Outcome: Progressing Goal: Ability to manage health-related needs will improve Outcome: Progressing   Problem: Metabolic: Goal: Ability to maintain appropriate glucose levels will improve Outcome: Progressing   Problem: Nutritional: Goal: Maintenance of adequate nutrition will improve Outcome: Progressing Goal: Progress toward achieving an optimal weight will improve Outcome: Progressing   Problem: Skin Integrity: Goal: Risk for impaired skin integrity will decrease Outcome: Progressing   Problem: Tissue Perfusion: Goal: Adequacy of tissue perfusion will improve Outcome: Progressing   Problem: Education: Goal: Knowledge of condition will improve Outcome: Progressing Goal: Individualized Educational Video(s) Outcome: Progressing Goal: Individualized Newborn Educational Video(s) Outcome: Progressing   Problem: Activity: Goal: Will verbalize the importance of balancing activity with adequate rest periods Outcome: Progressing Goal: Ability to tolerate increased activity will improve Outcome: Progressing   Problem: Coping: Goal: Ability to identify and utilize available resources and services will improve Outcome: Progressing   Problem: Life Cycle: Goal: Chance of risk for complications during the postpartum period will decrease Outcome: Progressing   Problem: Role Relationship: Goal: Ability to demonstrate positive interaction with newborn will improve Outcome: Progressing   Problem: Skin Integrity: Goal: Demonstration of wound healing without infection will improve Outcome: Progressing

## 2024-01-19 NOTE — Anesthesia Procedure Notes (Signed)
 Spinal  Patient location during procedure: OB Start time: 01/19/2024 7:44 AM End time: 01/19/2024 7:48 AM Reason for block: surgical anesthesia Staffing Performed: anesthesiologist  Anesthesiologist: Jefm Garnette LABOR, MD Performed by: Jefm Garnette LABOR, MD Authorized by: Jefm Garnette LABOR, MD   Preanesthetic Checklist Completed: patient identified, IV checked, risks and benefits discussed, surgical consent, monitors and equipment checked, pre-op evaluation and timeout performed Spinal Block Patient position: sitting Prep: DuraPrep and site prepped and draped Patient monitoring: heart rate, cardiac monitor, continuous pulse ox and blood pressure Approach: midline Location: L3-4 Injection technique: single-shot Needle Needle type: Pencan  Needle gauge: 24 G Needle length: 10 cm Needle insertion depth: 6 cm Assessment Sensory level: T4 Events: CSF return Additional Notes 1 Attempt (s). Pt tolerated procedure well.

## 2024-01-19 NOTE — Transfer of Care (Signed)
 Immediate Anesthesia Transfer of Care Note  Patient: Brenda Mcdaniel  Procedure(s) Performed: CESAREAN DELIVERY  Patient Location: PACU  Anesthesia Type:Spinal  Level of Consciousness: awake, alert , and oriented  Airway & Oxygen Therapy: Patient Spontanous Breathing  Post-op Assessment: Report given to RN and Post -op Vital signs reviewed and stable  Post vital signs: Reviewed and stable  Last Vitals:  Vitals Value Taken Time  BP 104/66 01/19/24 09:41  Temp 36.6 C 01/19/24 09:41  Pulse 74 01/19/24 09:44  Resp 12 01/19/24 09:44  SpO2 97 % 01/19/24 09:44  Vitals shown include unfiled device data.  Last Pain:  Vitals:   01/19/24 0941  TempSrc: Oral         Complications: No notable events documented.

## 2024-01-20 LAB — CBC
HCT: 28.3 % — ABNORMAL LOW (ref 36.0–46.0)
Hemoglobin: 9 g/dL — ABNORMAL LOW (ref 12.0–15.0)
MCH: 25.2 pg — ABNORMAL LOW (ref 26.0–34.0)
MCHC: 31.8 g/dL (ref 30.0–36.0)
MCV: 79.3 fL — ABNORMAL LOW (ref 80.0–100.0)
Platelets: 230 K/uL (ref 150–400)
RBC: 3.57 MIL/uL — ABNORMAL LOW (ref 3.87–5.11)
RDW: 15 % (ref 11.5–15.5)
WBC: 13.7 K/uL — ABNORMAL HIGH (ref 4.0–10.5)
nRBC: 0 % (ref 0.0–0.2)

## 2024-01-20 MED ORDER — FERROUS SULFATE 325 (65 FE) MG PO TABS
325.0000 mg | ORAL_TABLET | ORAL | Status: DC
  Administered 2024-01-20 – 2024-01-22 (×2): 325 mg via ORAL
  Filled 2024-01-20 (×2): qty 1

## 2024-01-20 NOTE — Progress Notes (Signed)
 [name] was referred for history of depression/anxiety.  * Referral screened out by Clinical Social Worker because none of the following criteria appear to apply:  ~ History of anxiety/depression during this pregnancy, or of post-partum depression following prior delivery.  ~ Diagnosis of anxiety and/or depression within last 3 years  OR  * [name]'s symptoms currently being treated with medication and/or therapy. Per OB notes, [name] has an active prescription for Wellbutrin XL 150mg  and Escitalopram 20mg  for support.  Edinburgh score 7  Please contact the Clinical Social Worker if needs arise, by [facility] request, or if [name] scores greater than 9/yes to question 10 on Edinburgh Postpartum Depression Screen.  Brenda Mcdaniel, [place] Clinical Social Worker [phone]

## 2024-01-20 NOTE — Progress Notes (Signed)
 Subjective: Postpartum Day 1: Cesarean Delivery Patient reports pain controlled, no nausea or vomiting, ambulating without difficulty.  Voiding without difficulty.  Objective: Vital signs in last 24 hours: Temp:  [97.8 F (36.6 C)-98.5 F (36.9 C)] 98 F (36.7 C) (10/03 0242) Pulse Rate:  [65-71] 65 (10/03 0242) Resp:  [17-19] 19 (10/03 0242) BP: (97-107)/(47-61) 97/54 (10/03 0242) SpO2:  [98 %-100 %] 99 % (10/03 0242)  Physical Exam:  General: alert, cooperative, and appears stated age Lochia: appropriate Uterine Fundus: firm Incision: healing well DVT Evaluation: No evidence of DVT seen on physical exam.  Recent Labs    01/19/24 1241 01/20/24 0447  HGB 10.1* 9.0*  HCT 31.5* 28.3*    Assessment/Plan: Status post Cesarean section. Doing well postoperatively.  Continue current care. Desires neonatal circumcision, R/B/A of procedure discussed at length. Pt understands that neonatal circumcision is not considered medically necessary and is elective. The risks include, but are not limited to bleeding, infection, damage to the penis, development of scar tissue, and having to have it redone at a later date. Pt understands theses risks and wishes to proceed Acute blood loss anemia: Hemoglobin dropped from 11.2-9.0.  Appropriate drop in hemoglobin.  No additional interventions recommended at this time.  Will have patient continue prenatal vitamin with iron.  Marjorie Gull, MD 01/20/2024, 11:45 AM

## 2024-01-21 MED ORDER — BUTALBITAL-APAP-CAFFEINE 50-325-40 MG PO TABS
1.0000 | ORAL_TABLET | Freq: Once | ORAL | Status: DC
  Filled 2024-01-21: qty 1

## 2024-01-21 MED ORDER — ACETAMINOPHEN 325 MG PO TABS
650.0000 mg | ORAL_TABLET | Freq: Four times a day (QID) | ORAL | Status: DC
  Administered 2024-01-21 – 2024-01-22 (×2): 650 mg via ORAL
  Filled 2024-01-21 (×2): qty 2

## 2024-01-21 MED ORDER — KETOROLAC TROMETHAMINE 30 MG/ML IJ SOLN
30.0000 mg | Freq: Once | INTRAMUSCULAR | Status: AC
  Administered 2024-01-21: 30 mg via INTRAMUSCULAR

## 2024-01-21 MED ORDER — DIPHENHYDRAMINE HCL 25 MG PO CAPS
50.0000 mg | ORAL_CAPSULE | Freq: Once | ORAL | Status: AC
  Administered 2024-01-21: 50 mg via ORAL
  Filled 2024-01-21: qty 2

## 2024-01-21 MED ORDER — METOCLOPRAMIDE HCL 10 MG PO TABS
10.0000 mg | ORAL_TABLET | Freq: Once | ORAL | Status: AC
  Administered 2024-01-21: 10 mg via ORAL
  Filled 2024-01-21: qty 1

## 2024-01-21 MED ORDER — BUTALBITAL-APAP-CAFFEINE 50-325-40 MG PO TABS
1.0000 | ORAL_TABLET | Freq: Once | ORAL | Status: AC | PRN
  Administered 2024-01-22: 1 via ORAL

## 2024-01-21 MED ORDER — CYCLOBENZAPRINE HCL 10 MG PO TABS
10.0000 mg | ORAL_TABLET | Freq: Once | ORAL | Status: AC
  Administered 2024-01-21: 10 mg via ORAL
  Filled 2024-01-21: qty 1

## 2024-01-21 MED ORDER — KETOROLAC TROMETHAMINE 30 MG/ML IJ SOLN
30.0000 mg | Freq: Once | INTRAMUSCULAR | Status: DC
Start: 2024-01-21 — End: 2024-01-21
  Filled 2024-01-21: qty 1

## 2024-01-21 MED ORDER — METFORMIN HCL ER 500 MG PO TB24
500.0000 mg | ORAL_TABLET | Freq: Every day | ORAL | Status: DC
  Administered 2024-01-22: 500 mg via ORAL
  Filled 2024-01-21: qty 1

## 2024-01-21 NOTE — Progress Notes (Addendum)
 Subjective: Postpartum Day 2: Cesarean Delivery Patient reports pain controlled, no nausea or vomiting, ambulating without difficulty.  Voiding without difficulty. Reports consistent headache since yesterday, not very responsive to meds she is receiving; currently a 5 out of 10. Left-sided, worse with sitting and standing, improved with laying down.   Objective: Vital signs in last 24 hours: Temp:  [98.2 F (36.8 C)-98.3 F (36.8 C)] 98.3 F (36.8 C) (10/04 0619) Pulse Rate:  [70-75] 75 (10/04 0619) Resp:  [18-19] 18 (10/04 0619) BP: (97-117)/(55-73) 111/73 (10/04 0619) SpO2:  [99 %-100 %] 100 % (10/04 9380)  Physical Exam:  General: alert, cooperative, and appears stated age Lochia: appropriate Uterine Fundus: firm Incision: Honeycomb in place, no strikethrough DVT Evaluation: No evidence of DVT seen on physical exam.  Recent Labs    01/19/24 1241 01/20/24 0447  HGB 10.1* 9.0*  HCT 31.5* 28.3*    Assessment/Plan: Brenda Mcdaniel is a 38 y.o. G3 now P29 female s/p rCS at [redacted]w[redacted]d for scheduled repeat C-section.  -POD#2: doing well. Continue routine postpartum/post-op care. -headache: could be MSK-related, associated with anemia or lack of sleep, or post-dural. Not resolved with Tylenol , ibuprofen , and gabapentin. Will try 10mg  flexeril and if not significantly improved or resolved, will consult anesthesia for evaluation for blood patch.   -gDMA2: post-op CBG wnl. Patient desires to go back on her pre-pregnancy dose of metformin 500mg  daily; ordered. Plan 6w postpartum 2hr GTT -anxiety: on wellbutrin  150mg  daily and escitalopram  20mg  daily. Reports good mood. -PPH/Acute blood loss anemia, clinically significant for this hospitalization: Hgb 11.4-9.0. Asx. On PO iron every other day. -baby boy: s/p circumcision  Dispo: Anticipate discharge POD#3.  Brenda DELENA Husky, MD 01/21/2024, 9:53 AM

## 2024-01-21 NOTE — Progress Notes (Signed)
 Called to see patient for headache.  Pt eating lunch in seated position and complains that ongoing HA is left sided frontal and occipital.  Started yesterday afternoon.  Does not resolve with position change or intensity with laying flat. I am not convinced that it is related to the spinal for C/S and would at this point treat conservatively with fluids and caffeine. PT reassured and I answered all questions.  I told her that if it lasted greater than a week or changed to call the anesthesiologist [facility] at [facility].  Ej Dashonda Bonneau jrmd

## 2024-01-22 MED ORDER — OXYCODONE HCL 5 MG PO TABS: 5.0000 mg | ORAL_TABLET | ORAL | 0 refills | Status: AC | PRN

## 2024-01-22 MED ORDER — IBUPROFEN 600 MG PO TABS: 600.0000 mg | ORAL_TABLET | Freq: Four times a day (QID) | ORAL | Status: AC

## 2024-01-22 MED ORDER — PRENATAL MULTIVITAMIN CH: 1.0000 | ORAL_TABLET | Freq: Every day | ORAL | Status: AC

## 2024-01-22 MED ORDER — ACETAMINOPHEN 325 MG PO TABS: 650.0000 mg | ORAL_TABLET | Freq: Four times a day (QID) | ORAL | Status: AC

## 2024-01-22 MED ORDER — FERROUS SULFATE 325 (65 FE) MG PO TABS: 325.0000 mg | ORAL_TABLET | ORAL | Status: AC

## 2024-01-22 NOTE — Discharge Summary (Signed)
 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 yo G3P3004 at [redacted]w[redacted]d was admitted to the hospital 01/19/2024 for scheduled cesarean section with the following indication:Elective Repeat.Delivery details are as follows:  Membrane Rupture Time/Date: 8:36 AM,01/19/2024  Delivery Method:C-Section, Low Transverse Details of operation can be found in separate operative note.  Patient had a postpartum course complicated by postpartum headache, which resolved with treatment.  She is ambulating, tolerating a regular diet, passing flatus, and urinating well. Patient is discharged home in stable condition on  01/22/24        Newborn Data: Birth date:01/19/2024 Birth time:8:36 AM Gender:Female Living status:Living Apgars:8 ,8  Weight:3350 g    Magnesium Sulfate received: No BMZ received: No Rhophylac:No MMR:No T-DaP:Given prenatally Flu: Yes RSV Vaccine received: No Transfusion:No Immunizations administered: Immunization History  Administered Date(s) Administered   Influenza Inj Mdck Quad Pf 01/21/2020   Influenza, Mdck, Trivalent,PF 6+ MOS(egg free) 12/29/2023   Tdap 01/07/2020, 02/24/2022, 11/09/2023    Physical exam  Vitals:   01/21/24 0619 01/21/24 1300 01/21/24 2111 01/22/24  0555  BP: 111/73 (!) 119/54 124/65 111/64  Pulse: 75 78 81 68  Resp: 18 16 16    Temp: 98.3 F (36.8 C) 98.8 F (37.1 C) 98.2 F (36.8 C) 97.9 F (36.6 C)  TempSrc: Oral Oral Oral Oral  SpO2: 100% 100% 97% 99%  Weight:      Height:       General: alert, cooperative, and no distress Lochia: appropriate Uterine Fundus: firm Incision: Healing well with no significant drainage DVT Evaluation: No evidence of DVT seen on physical exam. Labs: Lab Results  Component Value Date   WBC 13.7 (H) 01/20/2024   HGB 9.0 (L) 01/20/2024   HCT 28.3 (L) 01/20/2024   MCV 79.3 (L) 01/20/2024   PLT 230 01/20/2024       No data to display         Edinburgh Score:    01/19/2024   12:15 PM  Edinburgh Postnatal Depression Scale Screening Tool  I have been able to laugh and see the funny side of things. 0  I have looked forward with enjoyment to things. 0  I have blamed myself unnecessarily when things went wrong. 1  I have been anxious or worried for no good reason. 2  I have felt scared or panicky for no good reason. 1  Things have been getting on top of me. 1  I have been so unhappy that I have had difficulty sleeping. 1  I have felt sad or miserable. 1  I have been so unhappy that I have been crying. 0  The thought of harming myself has occurred to me.  0  Edinburgh Postnatal Depression Scale Total 7      After visit meds:  Allergies as of 01/22/2024       Reactions   Bactrim [sulfamethoxazole-trimethoprim] Itching   Yeast infection        Medication List     STOP taking these medications    famotidine 20 MG tablet Commonly known as: PEPCID   folic acid 1 MG tablet Commonly known as: FOLVITE   VITAMIN D PO       TAKE these medications    acetaminophen  325 MG tablet Commonly known as: TYLENOL  Take 2 tablets (650 mg total) by mouth every 6 (six) hours. What changed:  medication strength how much to take when to take this reasons to take this   buPROPion   150 MG 24 hr tablet Commonly known as: WELLBUTRIN  XL Take 150 mg by mouth in the morning.   escitalopram  20 MG tablet Commonly known as: LEXAPRO  Take 20 mg by mouth in the morning.   ferrous sulfate  325 (65 FE) MG tablet Take 1 tablet (325 mg total) by mouth every other day. Start taking on: January 24, 2024 What changed:  medication strength how much to take when to take this   ibuprofen  600 MG tablet Commonly known as: ADVIL  Take 1 tablet (600 mg total) by mouth every 6 (six) hours.   metFORMIN 500 MG 24 hr tablet Commonly known as: GLUCOPHAGE-XR Take 500-1,000 mg by mouth See admin instructions. Take 1000mg  (2 tablets) by mouth every morning and 500mg  (1 tablet) every night.   oxyCODONE  5 MG immediate release tablet Commonly known as: Oxy IR/ROXICODONE  Take 1 tablet (5 mg total) by mouth every 4 (four) hours as needed for moderate pain (pain score 4-6).   prenatal multivitamin Tabs tablet Take 1 tablet by mouth daily at 12 noon. Start taking on: January 23, 2024 What changed:  when to take this additional instructions         Discharge home in stable condition Infant Feeding: Bottle Infant Disposition:home with mother Discharge instruction: per After Visit Summary and Postpartum booklet. Activity: Advance as tolerated. Pelvic rest for 6 weeks.  Diet: routine diet Anticipated Birth Control: Unsure Postpartum Appointment:1 week Additional Postpartum F/U: Postpartum Depression checkup, 2 hour GTT, and Incision check 1 week Future Appointments:No future appointments. Follow up Visit:  Follow-up Information     Claire Raman A, MD Follow up in 1 week(s).   Specialty: Obstetrics and Gynecology Contact information: [address] [place] [address] [phone]                     01/22/2024 Raman DELENA Claire, MD

## 2024-01-22 NOTE — Progress Notes (Signed)
 Subjective: Postpartum Day 3: Cesarean Delivery Patient reports pain controlled, no nausea or vomiting, passing gas, had a BM. Ambulating without difficulty.  Voiding without difficulty. Reports headache currently resolved. Tolerating PO.   Objective: Vital signs in last 24 hours: Temp:  [97.9 F (36.6 C)-98.8 F (37.1 C)] 97.9 F (36.6 C) (10/05 0555) Pulse Rate:  [68-81] 68 (10/05 0555) Resp:  [16] 16 (10/04 2111) BP: (111-124)/(54-65) 111/64 (10/05 0555) SpO2:  [97 %-100 %] 99 % (10/05 0555)  Physical Exam:  General: alert, cooperative, and appears stated age Lochia: appropriate Uterine Fundus: firm Incision: c/d/I, well healing DVT Evaluation: No evidence of DVT seen on physical exam.  Recent Labs    01/19/24 1241 01/20/24 0447  HGB 10.1* 9.0*  HCT 31.5* 28.3*    Assessment/Plan: Brenda Mcdaniel is a 38 y.o. G3 now P53 female s/p rCS at [redacted]w[redacted]d for scheduled repeat C-section.  -POD#3: doing well. Continue routine postpartum/post-op care. -headache: resolved with treatment.  -gDMA2: post-op CBG wnl. Patient desires to go back on her pre-pregnancy dose of metformin 500mg  daily; ordered. Plan 6w postpartum 2hr GTT -anxiety: on wellbutrin  150mg  daily and escitalopram  20mg  daily. Reports good mood. -PPH/Acute blood loss anemia, clinically significant for this hospitalization: Hgb 11.4-9.0. Asx. On PO iron every other day. -baby boy: s/p circumcision  Dispo: Anticipate discharge this afternoon.  Brenda DELENA Husky, MD 01/22/2024, 12:14 PM

## 2024-01-23 LAB — SURGICAL PATHOLOGY

## 2024-01-31 ENCOUNTER — Telehealth (HOSPITAL_COMMUNITY): Payer: Self-pay | Admitting: *Deleted

## 2024-01-31 NOTE — Telephone Encounter (Signed)
 01/31/2024  Name: Brenda Mcdaniel MRN: [id number] DOB: 1985-06-22  Reason for Call:  Transition of Care Hospital Discharge Call  Contact Status: Patient Contact Status: Message  Language assistant needed:          Follow-Up Questions:    Van Postnatal Depression Scale:  In the Past 7 Days:    PHQ2-9 Depression Scale:     Discharge Follow-up:    Post-discharge interventions: NA  Shavar Gorka,RN  01/31/2024 1159
